# Patient Record
Sex: Male | Born: 1945 | Race: White | Hispanic: No | Marital: Married | Smoking: Former smoker
Health system: Southern US, Community
[De-identification: ages and names within clinical notes are randomized; demographics above are authoritative.]

## PROBLEM LIST (undated history)

## (undated) DIAGNOSIS — G4733 Obstructive sleep apnea (adult) (pediatric): Secondary | ICD-10-CM

## (undated) DIAGNOSIS — R197 Diarrhea, unspecified: Secondary | ICD-10-CM

## (undated) DIAGNOSIS — F329 Major depressive disorder, single episode, unspecified: Secondary | ICD-10-CM

## (undated) DIAGNOSIS — C679 Malignant neoplasm of bladder, unspecified: Secondary | ICD-10-CM

## (undated) DIAGNOSIS — G4730 Sleep apnea, unspecified: Secondary | ICD-10-CM

## (undated) DIAGNOSIS — N529 Male erectile dysfunction, unspecified: Secondary | ICD-10-CM

## (undated) DIAGNOSIS — M4312 Spondylolisthesis, cervical region: Secondary | ICD-10-CM

## (undated) DIAGNOSIS — M5459 Other low back pain: Secondary | ICD-10-CM

## (undated) DIAGNOSIS — R399 Unspecified symptoms and signs involving the genitourinary system: Secondary | ICD-10-CM

## (undated) DIAGNOSIS — J45909 Unspecified asthma, uncomplicated: Secondary | ICD-10-CM

## (undated) DIAGNOSIS — E119 Type 2 diabetes mellitus without complications: Secondary | ICD-10-CM

## (undated) DIAGNOSIS — E669 Obesity, unspecified: Secondary | ICD-10-CM

## (undated) DIAGNOSIS — Z87442 Personal history of urinary calculi: Secondary | ICD-10-CM

## (undated) DIAGNOSIS — I2510 Atherosclerotic heart disease of native coronary artery without angina pectoris: Secondary | ICD-10-CM

## (undated) DIAGNOSIS — M5030 Other cervical disc degeneration, unspecified cervical region: Secondary | ICD-10-CM

## (undated) DIAGNOSIS — E291 Testicular hypofunction: Secondary | ICD-10-CM

## (undated) DIAGNOSIS — I456 Pre-excitation syndrome: Secondary | ICD-10-CM

## (undated) DIAGNOSIS — K219 Gastro-esophageal reflux disease without esophagitis: Secondary | ICD-10-CM

## (undated) DIAGNOSIS — N400 Enlarged prostate without lower urinary tract symptoms: Secondary | ICD-10-CM

## (undated) DIAGNOSIS — M1990 Unspecified osteoarthritis, unspecified site: Secondary | ICD-10-CM

## (undated) DIAGNOSIS — M545 Low back pain: Secondary | ICD-10-CM

## (undated) DIAGNOSIS — I10 Essential (primary) hypertension: Secondary | ICD-10-CM

## (undated) DIAGNOSIS — I219 Acute myocardial infarction, unspecified: Secondary | ICD-10-CM

## (undated) DIAGNOSIS — F32A Depression, unspecified: Secondary | ICD-10-CM

## (undated) HISTORY — PX: BACK SURGERY: SHX140

## (undated) HISTORY — PX: COLONOSCOPY: SHX174

## (undated) HISTORY — PX: X-STOP IMPLANTATION: SHX2677

## (undated) HISTORY — PX: OTHER SURGICAL HISTORY: SHX169

## (undated) HISTORY — PX: LITHOTRIPSY: SUR834

## (undated) HISTORY — PX: HAND SURGERY: SHX662

---

## 1998-05-07 DIAGNOSIS — I219 Acute myocardial infarction, unspecified: Secondary | ICD-10-CM

## 1998-05-07 HISTORY — PX: CARDIAC CATHETERIZATION: SHX172

## 1998-05-07 HISTORY — DX: Acute myocardial infarction, unspecified: I21.9

## 2003-07-27 ENCOUNTER — Other Ambulatory Visit: Payer: Self-pay

## 2005-03-01 ENCOUNTER — Ambulatory Visit: Payer: Self-pay | Admitting: Unknown Physician Specialty

## 2005-05-23 ENCOUNTER — Ambulatory Visit: Payer: Self-pay | Admitting: Internal Medicine

## 2005-09-04 ENCOUNTER — Inpatient Hospital Stay (HOSPITAL_COMMUNITY): Admission: RE | Admit: 2005-09-04 | Discharge: 2005-09-07 | Payer: Self-pay | Admitting: Neurosurgery

## 2006-01-14 ENCOUNTER — Ambulatory Visit: Payer: Self-pay | Admitting: Orthopedic Surgery

## 2006-03-05 ENCOUNTER — Ambulatory Visit: Payer: Self-pay | Admitting: Urology

## 2006-03-22 ENCOUNTER — Ambulatory Visit: Payer: Self-pay | Admitting: Urology

## 2006-03-25 ENCOUNTER — Ambulatory Visit: Payer: Self-pay | Admitting: Gastroenterology

## 2006-05-23 ENCOUNTER — Ambulatory Visit: Payer: Self-pay | Admitting: Gastroenterology

## 2006-07-30 ENCOUNTER — Ambulatory Visit: Payer: Self-pay | Admitting: Urology

## 2007-11-29 ENCOUNTER — Emergency Department: Payer: Self-pay | Admitting: Emergency Medicine

## 2007-12-03 ENCOUNTER — Ambulatory Visit: Payer: Self-pay | Admitting: Urology

## 2007-12-09 ENCOUNTER — Ambulatory Visit: Payer: Self-pay | Admitting: Urology

## 2009-03-03 ENCOUNTER — Ambulatory Visit: Payer: Self-pay | Admitting: Urology

## 2009-03-08 ENCOUNTER — Ambulatory Visit: Payer: Self-pay | Admitting: Urology

## 2009-03-09 ENCOUNTER — Ambulatory Visit: Payer: Self-pay | Admitting: Urology

## 2009-07-06 ENCOUNTER — Emergency Department: Payer: Self-pay | Admitting: Emergency Medicine

## 2009-07-09 ENCOUNTER — Inpatient Hospital Stay: Payer: Self-pay | Admitting: Internal Medicine

## 2010-04-07 ENCOUNTER — Emergency Department: Payer: Self-pay | Admitting: Emergency Medicine

## 2010-04-08 ENCOUNTER — Emergency Department: Payer: Self-pay | Admitting: Emergency Medicine

## 2010-04-27 ENCOUNTER — Ambulatory Visit: Payer: Self-pay | Admitting: Urology

## 2010-05-03 ENCOUNTER — Ambulatory Visit: Payer: Self-pay | Admitting: Urology

## 2010-05-04 ENCOUNTER — Ambulatory Visit: Payer: Self-pay | Admitting: Urology

## 2010-05-17 ENCOUNTER — Ambulatory Visit: Payer: Self-pay | Admitting: Urology

## 2010-09-30 ENCOUNTER — Emergency Department: Payer: Self-pay | Admitting: Internal Medicine

## 2010-10-23 ENCOUNTER — Ambulatory Visit: Payer: Self-pay | Admitting: Urology

## 2012-03-26 DIAGNOSIS — N281 Cyst of kidney, acquired: Secondary | ICD-10-CM | POA: Insufficient documentation

## 2012-03-26 DIAGNOSIS — Z8551 Personal history of malignant neoplasm of bladder: Secondary | ICD-10-CM | POA: Insufficient documentation

## 2013-11-03 ENCOUNTER — Other Ambulatory Visit: Payer: Self-pay | Admitting: Gastroenterology

## 2013-11-03 LAB — CLOSTRIDIUM DIFFICILE(ARMC)

## 2013-11-05 LAB — STOOL CULTURE

## 2014-01-01 ENCOUNTER — Ambulatory Visit: Payer: Self-pay | Admitting: Gastroenterology

## 2014-01-04 LAB — PATHOLOGY REPORT

## 2014-01-07 DIAGNOSIS — I456 Pre-excitation syndrome: Secondary | ICD-10-CM | POA: Insufficient documentation

## 2014-01-07 DIAGNOSIS — I219 Acute myocardial infarction, unspecified: Secondary | ICD-10-CM | POA: Insufficient documentation

## 2014-01-31 DIAGNOSIS — G4733 Obstructive sleep apnea (adult) (pediatric): Secondary | ICD-10-CM | POA: Insufficient documentation

## 2014-01-31 DIAGNOSIS — E669 Obesity, unspecified: Secondary | ICD-10-CM | POA: Insufficient documentation

## 2014-01-31 DIAGNOSIS — I10 Essential (primary) hypertension: Secondary | ICD-10-CM | POA: Insufficient documentation

## 2014-01-31 DIAGNOSIS — I2510 Atherosclerotic heart disease of native coronary artery without angina pectoris: Secondary | ICD-10-CM | POA: Insufficient documentation

## 2014-02-15 DIAGNOSIS — F325 Major depressive disorder, single episode, in full remission: Secondary | ICD-10-CM | POA: Insufficient documentation

## 2014-02-15 DIAGNOSIS — Z8659 Personal history of other mental and behavioral disorders: Secondary | ICD-10-CM | POA: Insufficient documentation

## 2014-02-16 ENCOUNTER — Ambulatory Visit: Payer: Self-pay | Admitting: Gastroenterology

## 2014-02-18 LAB — PATHOLOGY REPORT

## 2014-05-19 ENCOUNTER — Ambulatory Visit: Payer: Self-pay | Admitting: Gastroenterology

## 2015-02-22 DIAGNOSIS — M5030 Other cervical disc degeneration, unspecified cervical region: Secondary | ICD-10-CM | POA: Insufficient documentation

## 2015-05-25 DIAGNOSIS — M1611 Unilateral primary osteoarthritis, right hip: Secondary | ICD-10-CM | POA: Insufficient documentation

## 2015-09-07 ENCOUNTER — Ambulatory Visit: Payer: Medicare Other | Admitting: Anesthesiology

## 2015-09-07 ENCOUNTER — Encounter: Payer: Self-pay | Admitting: *Deleted

## 2015-09-07 ENCOUNTER — Ambulatory Visit
Admission: RE | Admit: 2015-09-07 | Discharge: 2015-09-07 | Disposition: A | Discharge status: Home / Self Care | Payer: Medicare Other | Source: Ambulatory Visit | Attending: Unknown Physician Specialty | Admitting: Unknown Physician Specialty

## 2015-09-07 ENCOUNTER — Encounter
Admission: RE | Disposition: A | Discharge status: Home / Self Care | Payer: Self-pay | Source: Ambulatory Visit | Attending: Unknown Physician Specialty

## 2015-09-07 DIAGNOSIS — G4730 Sleep apnea, unspecified: Secondary | ICD-10-CM | POA: Diagnosis not present

## 2015-09-07 DIAGNOSIS — Z6832 Body mass index (BMI) 32.0-32.9, adult: Secondary | ICD-10-CM | POA: Diagnosis not present

## 2015-09-07 DIAGNOSIS — J449 Chronic obstructive pulmonary disease, unspecified: Secondary | ICD-10-CM | POA: Insufficient documentation

## 2015-09-07 DIAGNOSIS — M1991 Primary osteoarthritis, unspecified site: Secondary | ICD-10-CM | POA: Insufficient documentation

## 2015-09-07 DIAGNOSIS — Z8551 Personal history of malignant neoplasm of bladder: Secondary | ICD-10-CM | POA: Insufficient documentation

## 2015-09-07 DIAGNOSIS — K5730 Diverticulosis of large intestine without perforation or abscess without bleeding: Secondary | ICD-10-CM | POA: Insufficient documentation

## 2015-09-07 DIAGNOSIS — E669 Obesity, unspecified: Secondary | ICD-10-CM | POA: Insufficient documentation

## 2015-09-07 DIAGNOSIS — E119 Type 2 diabetes mellitus without complications: Secondary | ICD-10-CM | POA: Insufficient documentation

## 2015-09-07 DIAGNOSIS — Z87891 Personal history of nicotine dependence: Secondary | ICD-10-CM | POA: Diagnosis not present

## 2015-09-07 DIAGNOSIS — D123 Benign neoplasm of transverse colon: Secondary | ICD-10-CM | POA: Diagnosis not present

## 2015-09-07 DIAGNOSIS — F329 Major depressive disorder, single episode, unspecified: Secondary | ICD-10-CM | POA: Insufficient documentation

## 2015-09-07 DIAGNOSIS — I252 Old myocardial infarction: Secondary | ICD-10-CM | POA: Insufficient documentation

## 2015-09-07 DIAGNOSIS — I10 Essential (primary) hypertension: Secondary | ICD-10-CM | POA: Diagnosis not present

## 2015-09-07 DIAGNOSIS — Z1211 Encounter for screening for malignant neoplasm of colon: Secondary | ICD-10-CM | POA: Diagnosis present

## 2015-09-07 DIAGNOSIS — G4733 Obstructive sleep apnea (adult) (pediatric): Secondary | ICD-10-CM | POA: Insufficient documentation

## 2015-09-07 DIAGNOSIS — Z79899 Other long term (current) drug therapy: Secondary | ICD-10-CM | POA: Insufficient documentation

## 2015-09-07 DIAGNOSIS — Z7982 Long term (current) use of aspirin: Secondary | ICD-10-CM | POA: Diagnosis not present

## 2015-09-07 HISTORY — DX: Atherosclerotic heart disease of native coronary artery without angina pectoris: I25.10

## 2015-09-07 HISTORY — DX: Benign prostatic hyperplasia without lower urinary tract symptoms: N40.0

## 2015-09-07 HISTORY — PX: COLONOSCOPY WITH PROPOFOL: SHX5780

## 2015-09-07 HISTORY — DX: Type 2 diabetes mellitus without complications: E11.9

## 2015-09-07 HISTORY — DX: Other cervical disc degeneration, unspecified cervical region: M50.30

## 2015-09-07 HISTORY — DX: Diarrhea, unspecified: R19.7

## 2015-09-07 HISTORY — DX: Depression, unspecified: F32.A

## 2015-09-07 HISTORY — DX: Other low back pain: M54.59

## 2015-09-07 HISTORY — DX: Obesity, unspecified: E66.9

## 2015-09-07 HISTORY — DX: Unspecified osteoarthritis, unspecified site: M19.90

## 2015-09-07 HISTORY — DX: Spondylolisthesis, cervical region: M43.12

## 2015-09-07 HISTORY — DX: Major depressive disorder, single episode, unspecified: F32.9

## 2015-09-07 HISTORY — DX: Acute myocardial infarction, unspecified: I21.9

## 2015-09-07 HISTORY — DX: Low back pain: M54.5

## 2015-09-07 HISTORY — DX: Essential (primary) hypertension: I10

## 2015-09-07 HISTORY — DX: Sleep apnea, unspecified: G47.30

## 2015-09-07 HISTORY — DX: Male erectile dysfunction, unspecified: N52.9

## 2015-09-07 HISTORY — DX: Unspecified symptoms and signs involving the genitourinary system: R39.9

## 2015-09-07 HISTORY — DX: Obstructive sleep apnea (adult) (pediatric): G47.33

## 2015-09-07 HISTORY — DX: Pre-excitation syndrome: I45.6

## 2015-09-07 HISTORY — DX: Malignant neoplasm of bladder, unspecified: C67.9

## 2015-09-07 HISTORY — DX: Testicular hypofunction: E29.1

## 2015-09-07 SURGERY — COLONOSCOPY WITH PROPOFOL
Anesthesia: General

## 2015-09-07 MED ORDER — PROPOFOL 500 MG/50ML IV EMUL
INTRAVENOUS | Status: DC | PRN
  Administered 2015-09-07: 120 ug/kg/min via INTRAVENOUS

## 2015-09-07 MED ORDER — SODIUM CHLORIDE 0.9 % IV SOLN
INTRAVENOUS | Status: DC
  Administered 2015-09-07: 08:00:00 via INTRAVENOUS

## 2015-09-07 MED ORDER — MIDAZOLAM HCL 2 MG/2ML IJ SOLN
INTRAMUSCULAR | Status: DC | PRN
  Administered 2015-09-07: 1 mg via INTRAVENOUS

## 2015-09-07 MED ORDER — FENTANYL CITRATE (PF) 100 MCG/2ML IJ SOLN
INTRAMUSCULAR | Status: DC | PRN
  Administered 2015-09-07: 50 ug via INTRAVENOUS

## 2015-09-07 MED ORDER — POLYVINYL ALCOHOL 1.4 % OP SOLN
1.0000 [drp] | OPHTHALMIC | Status: DC | PRN
  Administered 2015-09-07: 1 [drp] via OPHTHALMIC
  Filled 2015-09-07: qty 15

## 2015-09-07 MED ORDER — SODIUM CHLORIDE 0.9 % IV SOLN: INTRAVENOUS | Status: DC

## 2015-09-07 NOTE — Anesthesia Procedure Notes (Addendum)
Performed by: Vaughan Sine Pre-anesthesia Checklist: Emergency Drugs available, Patient identified, Suction available, Patient being monitored and Timeout performed Patient Re-evaluated:Patient Re-evaluated prior to inductionOxygen Delivery Method: Non-rebreather mask Preoxygenation: Pre-oxygenation with 100% oxygen Intubation Type: IV induction Placement Confirmation: positive ETCO2 and CO2 detector   Performed by: Donalda Ewings, Trajon Rosete Ventilation: Oral airway inserted - appropriate to patient size

## 2015-09-07 NOTE — Transfer of Care (Signed)
Immediate Anesthesia Transfer of Care Note  Patient: Eric Hanna  Procedure(s) Performed: Procedure(s): COLONOSCOPY WITH PROPOFOL (N/A)  Patient Location: PACU  Anesthesia Type:General  Level of Consciousness: awake and sedated  Airway & Oxygen Therapy: Patient Spontanous Breathing and Patient connected to face mask oxygen  Post-op Assessment: Report given to RN and Post -op Vital signs reviewed and stable  Post vital signs: Reviewed  Last Vitals:  Filed Vitals:   09/07/15 0738  BP: 125/76  Pulse: 63  Temp: 36.6 C  Resp: 17    Last Pain: There were no vitals filed for this visit.       Complications: No apparent anesthesia complications

## 2015-09-07 NOTE — Anesthesia Postprocedure Evaluation (Signed)
Anesthesia Post Note  Patient: Eric Hanna  Procedure(s) Performed: Procedure(s) (LRB): COLONOSCOPY WITH PROPOFOL (N/A)  Patient location during evaluation: Endoscopy Anesthesia Type: General Level of consciousness: awake and alert Pain management: pain level controlled Vital Signs Assessment: post-procedure vital signs reviewed and stable Respiratory status: spontaneous breathing, nonlabored ventilation, respiratory function stable and patient connected to nasal cannula oxygen Cardiovascular status: blood pressure returned to baseline and stable Postop Assessment: no signs of nausea or vomiting Anesthetic complications: no    Last Vitals:  Filed Vitals:   09/07/15 0915 09/07/15 0925  BP: 115/80 117/71  Pulse: 59   Temp:    Resp: 10     Last Pain: There were no vitals filed for this visit.               Martha Clan

## 2015-09-07 NOTE — Anesthesia Preprocedure Evaluation (Signed)
Anesthesia Evaluation  Patient identified by MRN, date of birth, ID band Patient awake    Reviewed: Allergy & Precautions, H&P , NPO status , Patient's Chart, lab work & pertinent test results, reviewed documented beta blocker date and time   History of Anesthesia Complications Negative for: history of anesthetic complications  Airway Mallampati: III  TM Distance: >3 FB Neck ROM: full    Dental no notable dental hx. (+) Caps   Pulmonary neg shortness of breath, sleep apnea , COPD, neg recent URI, former smoker,    Pulmonary exam normal breath sounds clear to auscultation       Cardiovascular Exercise Tolerance: Good hypertension, (-) angina+ CAD and + Past MI  (-) Cardiac Stents and (-) CABG Normal cardiovascular exam+ dysrhythmias (WPW) (-) Valvular Problems/Murmurs Rhythm:regular Rate:Normal     Neuro/Psych PSYCHIATRIC DISORDERS (Depression) negative neurological ROS     GI/Hepatic Neg liver ROS, GERD  ,  Endo/Other  diabetes (borderline)  Renal/GU Renal disease (kidney stones)  negative genitourinary   Musculoskeletal   Abdominal   Peds  Hematology negative hematology ROS (+)   Anesthesia Other Findings Past Medical History:   Osteoarthritis                                               Lumbar trigger point syndrome                                Spondylolisthesis of cervical region                         DDD (degenerative disc disease), cervical                    Depression                                                   Coronary artery disease                                      Sleep apnea                                                  Hypertension                                                 OSA (obstructive sleep apnea)                                WPW (Wolff-Parkinson-White syndrome)                         Obesity  Diarrhea                                                      Hypertrophy of prostate                                      Lower urinary tract symptoms (LUTS)                          Impotence, organic                                           Malignant neoplasm of bladder [facility])                          Testicular hypofunction                                      Myocardial infarction ([place])                     2000         Diabetes mellitus without complication ([place])                   Comment:borderline; no meds; no blood sugar checks   Reproductive/Obstetrics negative OB ROS                             Anesthesia Physical Anesthesia Plan  ASA: III  Anesthesia Plan: General   Post-op Pain Management:    Induction:   Airway Management Planned:   Additional Equipment:   Intra-op Plan:   Post-operative Plan:   Informed Consent: I have reviewed the patients History and Physical, chart, labs and discussed the procedure including the risks, benefits and alternatives for the proposed anesthesia with the patient or authorized representative who has indicated his/her understanding and acceptance.   Dental Advisory Given  Plan Discussed with: Anesthesiologist, CRNA and Surgeon  Anesthesia Plan Comments:         Anesthesia Quick Evaluation

## 2015-09-07 NOTE — Op Note (Signed)
[facility] Gastroenterology Patient Name: Eric Hanna Procedure Date: 09/07/2015 8:13 AM MRN: [id number] Account #: [id number] Date of Birth: 04-May-1946 Admit Type: Outpatient Age: 70 Room: [facility] ENDO ROOM 4 Gender: Male Note Status: Finalized Procedure:            Colonoscopy Indications:          High risk colon cancer surveillance: Personal history                        of colonic polyps Providers:            Manya Silvas, MD Referring MD:         Ocie Cornfield. Ouida Sills, MD (Referring MD) Medicines:            Propofol per Anesthesia Complications:        No immediate complications. Procedure:            Pre-Anesthesia Assessment:                       - After reviewing the risks and benefits, the patient                        was deemed in satisfactory condition to undergo the                        procedure.                       After obtaining informed consent, the colonoscope was                        passed under direct vision. Throughout the procedure,                        the patient's blood pressure, pulse, and oxygen                        saturations were monitored continuously. The                        Colonoscope was introduced through the anus and                        advanced to the the cecum, identified by appendiceal                        orifice and ileocecal valve. The colonoscopy was                        performed without difficulty. The patient tolerated the                        procedure well. The quality of the bowel preparation                        was excellent. Findings:      A 9 mm polyp was found in the transverse colon. The polyp was sessile.       The polyp was removed with a hot snare. Resection and retrieval were       complete. To prevent bleeding after the polypectomy, one hemostatic clip  was successfully placed. There was no bleeding during, or at the end, of       the procedure.      A small  polyp was found in the transverse colon. The polyp was sessile.       The polyp was removed with a hot snare. Resection and retrieval were       complete.      A small polyp was found in the transverse colon. The polyp was sessile.       The polyp was removed with a hot snare. Resection and retrieval were       complete.      A small polyp was found in the hepatic flexure. The polyp was sessile.       The polyp was removed with a hot snare. Resection and retrieval were       complete.      A diffuse area of granular mucosa was found at the ileocecal valve.       Biopsies were taken with a cold forceps for histology.      Multiple medium-mouthed diverticula were found in the sigmoid colon.      The terminal ileum appeared normal. Impression:           - One 9 mm polyp in the transverse colon, removed with                        a hot snare. Resected and retrieved. Clip was placed.                       - One small polyp in the transverse colon, removed with                        a hot snare. Resected and retrieved.                       - One small polyp in the transverse colon, removed with                        a hot snare. Resected and retrieved.                       - One small polyp at the hepatic flexure, removed with                        a hot snare. Resected and retrieved.                       - Granularity at the ileocecal valve. Biopsied.                       - Diverticulosis in the sigmoid colon.                       - The examined portion of the ileum was normal. Recommendation:       - Await pathology results. Manya Silvas, MD 09/07/2015 8:58:48 AM This report has been signed electronically. Number of Addenda: 0 Note Initiated On: 09/07/2015 8:13 AM Scope Withdrawal Time: 0 hours 17 minutes 31 seconds  Total Procedure Duration: 0 hours 30 minutes 30 seconds       [facility]

## 2015-09-07 NOTE — H&P (Signed)
Primary Care Physician:  Kirk Ruths., MD Primary Gastroenterologist:  Dr. Vira Agar  Pre-Procedure History & Physical: HPI:  Eric Hanna is a 70 y.o. male is here for an colonoscopy.   Past Medical History  Diagnosis Date  . Osteoarthritis   . Lumbar trigger point syndrome   . Spondylolisthesis of cervical region   . DDD (degenerative disc disease), cervical   . Depression   . Coronary artery disease   . Sleep apnea   . Hypertension   . OSA (obstructive sleep apnea)   . WPW (Wolff-Parkinson-White syndrome)   . Obesity   . Diarrhea   . Hypertrophy of prostate   . Lower urinary tract symptoms (LUTS)   . Impotence, organic   . Malignant neoplasm of bladder ([place])   . Testicular hypofunction   . Myocardial infarction ([place]) 2000  . Diabetes mellitus without complication ([place])     borderline; no meds; no blood sugar checks    Past Surgical History  Procedure Laterality Date  . Hand surgery    . Chin implant     . Stomach reduction without bypass    . Colonoscopy    . X-stop implantation    . Cardiac catheterization      Prior to Admission medications   Medication Sig Start Date End Date Taking? Authorizing Provider  alendronate (FOSAMAX) 70 MG tablet Take 70 mg by mouth once a week. Take with a full glass of water on an empty stomach.   Yes Historical Provider, MD  aspirin 81 MG tablet Take 81 mg by mouth daily.   Yes Historical Provider, MD  Calcium Carbonate-Vitamin D 600-400 MG-UNIT tablet Take 1 tablet by mouth daily.   Yes Historical Provider, MD  fexofenadine-pseudoephedrine (ALLEGRA-D 24) 180-240 MG 24 hr tablet Take 1 tablet by mouth daily.   Yes Historical Provider, MD  metoprolol succinate (TOPROL-XL) 50 MG 24 hr tablet Take 50 mg by mouth daily. Take with or immediately following a meal.   Yes Historical Provider, MD  Multiple Vitamin (MULTIVITAMIN) capsule Take 1 capsule by mouth daily.   Yes Historical Provider, MD  omeprazole (PRILOSEC) 20 MG  capsule Take 20 mg by mouth daily.   Yes Historical Provider, MD  sildenafil (VIAGRA) 100 MG tablet Take 100 mg by mouth daily as needed for erectile dysfunction.   Yes Historical Provider, MD  simvastatin (ZOCOR) 80 MG tablet Take 80 mg by mouth daily.   Yes Historical Provider, MD  Testosterone 75 MG PLLT 75 mg by Implant route.   Yes Historical Provider, MD  traMADol (ULTRAM) 50 MG tablet Take 100 mg by mouth every 6 (six) hours as needed.   Yes Historical Provider, MD    Allergies as of 08/30/2015  . (Not on File)    History reviewed. No pertinent family history.  Social History   Social History  . Marital Status: Married    Spouse Name: N/A  . Number of Children: N/A  . Years of Education: N/A   Occupational History  . Not on file.   Social History Main Topics  . Smoking status: Former Research scientist (life sciences)  . Smokeless tobacco: Not on file  . Alcohol Use: No  . Drug Use: No  . Sexual Activity: Not on file   Other Topics Concern  . Not on file   Social History Narrative    Review of Systems: See HPI, otherwise negative ROS  Physical Exam: BP 125/76 mmHg  Pulse 63  Temp(Src) 97.8 F (36.6 C) (Tympanic)  Resp 17  Ht 6\' 1"  (1.854 m)  Wt 113.399 kg (250 lb)  BMI 32.99 kg/m2  SpO2 99% General:   Alert,  pleasant and cooperative in NAD Head:  Normocephalic and atraumatic. Neck:  Supple; no masses or thyromegaly. Lungs:  Clear throughout to auscultation.    Heart:  Regular rate and rhythm. Abdomen:  Soft, nontender and nondistended. Normal bowel sounds, without guarding, and without rebound.   Neurologic:  Alert and  oriented x4;  grossly normal neurologically.  Impression/Plan: Eric Hanna is here for an colonoscopy to be performed for [facility] colon polyps  Risks, benefits, limitations, and alternatives regarding  colonoscopy have been reviewed with the patient.  Questions have been answered.  All parties agreeable.   Gaylyn Cheers, MD  09/07/2015, 8:12 AM

## 2015-09-09 ENCOUNTER — Encounter: Payer: Self-pay | Admitting: Unknown Physician Specialty

## 2015-09-09 LAB — SURGICAL PATHOLOGY

## 2016-12-27 ENCOUNTER — Emergency Department: Payer: Medicare Other

## 2016-12-27 ENCOUNTER — Emergency Department
Admission: EM | Admit: 2016-12-27 | Discharge: 2016-12-27 | Disposition: A | Discharge status: Home / Self Care | Payer: Medicare Other | Attending: Emergency Medicine | Admitting: Emergency Medicine

## 2016-12-27 ENCOUNTER — Encounter: Payer: Self-pay | Admitting: Medical Oncology

## 2016-12-27 DIAGNOSIS — E119 Type 2 diabetes mellitus without complications: Secondary | ICD-10-CM | POA: Diagnosis not present

## 2016-12-27 DIAGNOSIS — I10 Essential (primary) hypertension: Secondary | ICD-10-CM | POA: Insufficient documentation

## 2016-12-27 DIAGNOSIS — R42 Dizziness and giddiness: Secondary | ICD-10-CM | POA: Diagnosis not present

## 2016-12-27 DIAGNOSIS — Z87891 Personal history of nicotine dependence: Secondary | ICD-10-CM | POA: Diagnosis not present

## 2016-12-27 DIAGNOSIS — Z79899 Other long term (current) drug therapy: Secondary | ICD-10-CM | POA: Diagnosis not present

## 2016-12-27 DIAGNOSIS — I2510 Atherosclerotic heart disease of native coronary artery without angina pectoris: Secondary | ICD-10-CM | POA: Diagnosis not present

## 2016-12-27 DIAGNOSIS — Z7982 Long term (current) use of aspirin: Secondary | ICD-10-CM | POA: Diagnosis not present

## 2016-12-27 LAB — CBC WITH DIFFERENTIAL/PLATELET
Basophils Absolute: 0 10*3/uL (ref 0–0.1)
Basophils Relative: 1 %
EOS PCT: 4 %
Eosinophils Absolute: 0.2 10*3/uL (ref 0–0.7)
HCT: 50.6 % (ref 40.0–52.0)
Hemoglobin: 17.3 g/dL (ref 13.0–18.0)
LYMPHS ABS: 1.4 10*3/uL (ref 1.0–3.6)
LYMPHS PCT: 25 %
MCH: 30.3 pg (ref 26.0–34.0)
MCHC: 34.2 g/dL (ref 32.0–36.0)
MCV: 88.6 fL (ref 80.0–100.0)
MONO ABS: 0.6 10*3/uL (ref 0.2–1.0)
MONOS PCT: 11 %
Neutro Abs: 3.4 10*3/uL (ref 1.4–6.5)
Neutrophils Relative %: 59 %
PLATELETS: 137 10*3/uL — AB (ref 150–440)
RBC: 5.71 MIL/uL (ref 4.40–5.90)
RDW: 13.7 % (ref 11.5–14.5)
WBC: 5.6 10*3/uL (ref 3.8–10.6)

## 2016-12-27 LAB — URINALYSIS, COMPLETE (UACMP) WITH MICROSCOPIC
BACTERIA UA: NONE SEEN
Bilirubin Urine: NEGATIVE
Glucose, UA: NEGATIVE mg/dL
Hgb urine dipstick: NEGATIVE
KETONES UR: NEGATIVE mg/dL
Leukocytes, UA: NEGATIVE
Nitrite: NEGATIVE
Protein, ur: NEGATIVE mg/dL
SPECIFIC GRAVITY, URINE: 1.019 (ref 1.005–1.030)
SQUAMOUS EPITHELIAL / LPF: NONE SEEN
WBC, UA: NONE SEEN WBC/hpf (ref 0–5)
pH: 6 (ref 5.0–8.0)

## 2016-12-27 LAB — BASIC METABOLIC PANEL
Anion gap: 6 (ref 5–15)
BUN: 25 mg/dL — AB (ref 6–20)
CO2: 30 mmol/L (ref 22–32)
Calcium: 9.5 mg/dL (ref 8.9–10.3)
Chloride: 105 mmol/L (ref 101–111)
Creatinine, Ser: 1.13 mg/dL (ref 0.61–1.24)
GFR calc Af Amer: >60 mL/min (ref >60)
GLUCOSE: 135 mg/dL — AB (ref 65–99)
POTASSIUM: 4.4 mmol/L (ref 3.5–5.1)
Sodium: 141 mmol/L (ref 135–145)

## 2016-12-27 LAB — TROPONIN I: Troponin I: <0.03 ng/mL (ref <0.03)

## 2016-12-27 MED ORDER — MECLIZINE HCL 25 MG PO TABS
25.0000 mg | ORAL_TABLET | Freq: Three times a day (TID) | ORAL | 0 refills | Status: DC | PRN
Start: 2016-12-27 — End: 2017-08-09

## 2016-12-27 MED ORDER — DIAZEPAM 5 MG PO TABS: 5.0000 mg | ORAL_TABLET | Freq: Once | ORAL | Status: DC

## 2016-12-27 MED ORDER — DIAZEPAM 5 MG PO TABS: 5.0000 mg | ORAL_TABLET | Freq: Three times a day (TID) | ORAL | 0 refills | Status: DC | PRN

## 2016-12-27 MED ORDER — SODIUM CHLORIDE 0.9 % IV BOLUS (SEPSIS)
500.0000 mL | Freq: Once | INTRAVENOUS | Status: AC
  Administered 2016-12-27: 500 mL via INTRAVENOUS

## 2016-12-27 MED ORDER — MECLIZINE HCL 25 MG PO TABS
25.0000 mg | ORAL_TABLET | Freq: Once | ORAL | Status: AC
  Administered 2016-12-27: 25 mg via ORAL
  Filled 2016-12-27: qty 1

## 2016-12-27 NOTE — Consult Note (Signed)
Referring Physician: Alfred Levins    Chief Complaint: Dizziness  HPI: Eric Hanna is an 71 y.o. male with a history of DM and HTN who reports that he awakened on yesterday and when he stood fell back onto the bed.  This happened twice.  Patient reports feeling as if the ground is moving.  Was able to perform his usual activities but felt unsure walking.  Did well while lying down but on sitting or standing felt off balance.  Awakened this morning feeling no better and presented for evaluation.  Initial NIHSS of 0. Patient with no complaints of tinnitus or decreased hearing.  No recent illness.    Date last known well: Date: 12/25/2016 Time last known well: Time: 23:00 tPA Given: No: Outside treatment window  Past Medical History:  Diagnosis Date  . Coronary artery disease   . DDD (degenerative disc disease), cervical   . Depression   . Diabetes mellitus without complication ([place])    borderline; no meds; no blood sugar checks  . Diarrhea   . Hypertension   . Hypertrophy of prostate   . Impotence, organic   . Lower urinary tract symptoms (LUTS)   . Lumbar trigger point syndrome   . Malignant neoplasm of bladder ([place])   . Myocardial infarction ([place]) 2000  . Obesity   . OSA (obstructive sleep apnea)   . Osteoarthritis   . Sleep apnea   . Spondylolisthesis of cervical region   . Testicular hypofunction   . WPW (Wolff-Parkinson-White syndrome)     Past Surgical History:  Procedure Laterality Date  . CARDIAC CATHETERIZATION    . CHIN IMPLANT     . COLONOSCOPY    . COLONOSCOPY WITH PROPOFOL N/A 09/07/2015   Procedure: COLONOSCOPY WITH PROPOFOL;  Surgeon: Manya Silvas, MD;  Location: [facility] ENDOSCOPY;  Service: Endoscopy;  Laterality: N/A;  . HAND SURGERY    . STOMACH REDUCTION WITHOUT BYPASS    . X-STOP IMPLANTATION      Family history:  Father deceased from bladder cancer.  Mother deceased from emphysema  Social History:  reports that he has quit smoking. He does not have  any smokeless tobacco history on file. He reports that he does not drink alcohol or use drugs.  Allergies: No Known Allergies  Medications: I have reviewed the patient's current medications. Prior to Admission:  Prior to Admission medications   Medication Sig Start Date End Date Taking? Authorizing Provider  alendronate (FOSAMAX) 70 MG tablet Take 70 mg by mouth once a week. Take with a full glass of water on an empty stomach.   Yes [provider]  aspirin 81 MG tablet Take 81 mg by mouth daily.   Yes [provider]  Calcium Carbonate-Vitamin D 600-400 MG-UNIT tablet Take 1 tablet by mouth daily.   Yes [provider]  cetirizine (ZYRTEC) 10 MG tablet Take 10 mg by mouth daily.   Yes [provider]  COENZYME Q10 PO Take 1 capsule by mouth daily.   Yes [provider]  metoprolol succinate (TOPROL-XL) 50 MG 24 hr tablet Take 50 mg by mouth daily. Take with or immediately following a meal.   Yes [provider]  Multiple Vitamin (MULTIVITAMIN) capsule Take 1 capsule by mouth daily.   Yes [provider]  omeprazole (PRILOSEC) 20 MG capsule Take 20 mg by mouth daily.   Yes [provider]  sildenafil (VIAGRA) 100 MG tablet Take 100 mg by mouth daily as needed for erectile dysfunction.  Yes [provider]  simvastatin (ZOCOR) 80 MG tablet Take 80 mg by mouth daily.   Yes [provider]  Testosterone 75 MG PLLT 75 mg by Implant route. Every 4-6 months.   Yes [provider]  traZODone (DESYREL) 50 MG tablet Take 50 mg by mouth at bedtime. 11/12/16  Yes [provider]  diazepam (VALIUM) 5 MG tablet Take 1 tablet (5 mg total) by mouth every 8 (eight) hours as needed (dizziness). 12/27/16 12/27/17  Rudene Re, MD  meclizine (ANTIVERT) 25 MG tablet Take 1 tablet (25 mg total) by mouth 3 (three) times daily as needed for dizziness. 12/27/16   Rudene Re, MD     ROS: History  obtained from the patient  General ROS: negative for - chills, fatigue, fever, night sweats, weight gain or weight loss Psychological ROS: negative for - behavioral disorder, hallucinations, memory difficulties, mood swings or suicidal ideation Ophthalmic ROS: negative for - blurry vision, double vision, eye pain or loss of vision ENT ROS: negative for - epistaxis, nasal discharge, oral lesions, sore throat, tinnitus Allergy and Immunology ROS: negative for - hives or itchy/watery eyes Hematological and Lymphatic ROS: negative for - bleeding problems, bruising or swollen lymph nodes Endocrine ROS: negative for - galactorrhea, hair pattern changes, polydipsia/polyuria or temperature intolerance Respiratory ROS: negative for - cough, hemoptysis, shortness of breath or wheezing Cardiovascular ROS: negative for - chest pain, dyspnea on exertion, edema or irregular heartbeat Gastrointestinal ROS: negative for - abdominal pain, diarrhea, hematemesis, nausea/vomiting or stool incontinence Genito-Urinary ROS: negative for - dysuria, hematuria, incontinence or urinary frequency/urgency Musculoskeletal ROS: negative for - joint swelling or muscular weakness Neurological ROS: as noted in HPI Dermatological ROS: negative for rash and skin lesion changes  Physical Examination: Blood pressure 111/75, pulse 63, temperature 98 F (36.7 C), resp. rate 16, height 6\' 1"  (1.854 m), weight 109.8 kg (242 lb), SpO2 94 %.  HEENT-  Normocephalic, no lesions, without obvious abnormality.  Normal external eye and conjunctiva.  Normal TM's bilaterally.  Normal auditory canals and external ears. Normal external nose, mucus membranes and septum.  Normal pharynx. Cardiovascular- S1, S2 normal, pulses palpable throughout   Lungs- chest clear, no wheezing, rales, normal symmetric air entry Abdomen- soft, non-tender; bowel sounds normal; no masses,  no organomegaly Extremities- no edema Lymph-no adenopathy  palpable Musculoskeletal-no joint tenderness, deformity or swelling Skin-warm and dry, no hyperpigmentation, vitiligo, or suspicious lesions  Neurological Examination   Mental Status: Alert, oriented, thought content appropriate.  Speech fluent without evidence of aphasia.  Able to follow 3 step commands without difficulty. Cranial Nerves: II: Discs flat bilaterally; Visual fields grossly normal, pupils equal, round, reactive to light and accommodation III,IV, VI: ptosis not present, extra-ocular motions intact bilaterally.  Extinguishing nystagmus with change in position. V,VII: smile symmetric, facial light touch sensation normal bilaterally VIII: hearing normal bilaterally IX,X: gag reflex present XI: bilateral shoulder shrug XII: midline tongue extension Motor: Right : Upper extremity   5/5    Left:     Upper extremity   5/5  Lower extremity   5/5     Lower extremity   5/5 Tone and bulk:normal tone throughout; no atrophy noted Sensory: Pinprick and light touch intact throughout, bilaterally Deep Tendon Reflexes: 2+ and symmetric throughout Plantars: Right: downgoing   Left: downgoing Cerebellar: normal finger-to-nose, normal rapid alternating movements and normal heel-to-shin test Gait: guarded and wide based    Laboratory Studies:  Basic Metabolic Panel:  Recent Labs Lab 12/27/16 0919  NA 141  K 4.4  CL 105  CO2 30  GLUCOSE 135*  BUN 25*  CREATININE 1.13  CALCIUM 9.5    Liver Function Tests: No results for input(s): AST, ALT, ALKPHOS, BILITOT, PROT, ALBUMIN in the last 168 hours. No results for input(s): LIPASE, AMYLASE in the last 168 hours. No results for input(s): AMMONIA in the last 168 hours.  CBC:  Recent Labs Lab 12/27/16 0919  WBC 5.6  NEUTROABS 3.4  HGB 17.3  HCT 50.6  MCV 88.6  PLT 137*    Cardiac Enzymes:  Recent Labs Lab 12/27/16 0919  TROPONINI <0.03    BNP: Invalid input(s): POCBNP  CBG: No results for input(s): GLUCAP in  the last 168 hours.  Microbiology: Results for orders placed or performed in visit on 11/03/13  Stool culture     Status: None   Collection Time: 11/03/13  6:00 AM  Result Value Ref Range Status   Micro Text Report   Final       COMMENT                   NO SALMONELLA OR SHIGELLA ISOLATED   COMMENT                   NO PATHOGENIC E.COLI DETECTED   COMMENT                   NO CAMPYLOBACTER ANTIGEN DETECTED   ANTIBIOTIC                                                      Clostridium Difficile [facility])     Status: None   Collection Time: 11/03/13  6:00 AM  Result Value Ref Range Status   Micro Text Report   Final       C.DIFFICILE ANTIGEN       C.DIFFICILE GDH ANTIGEN : NEGATIVE   C.DIFFICILE TOXIN A/B     C.DIFFICILE TOXINS A AND B : NEGATIVE   INTERPRETATION            Negative for C. difficile.    ANTIBIOTIC                                                        Coagulation Studies: No results for input(s): LABPROT, INR in the last 72 hours.  Urinalysis:  Recent Labs Lab 12/27/16 1116  COLORURINE YELLOW*  LABSPEC 1.019  PHURINE 6.0  GLUCOSEU NEGATIVE  HGBUR NEGATIVE  BILIRUBINUR NEGATIVE  KETONESUR NEGATIVE  PROTEINUR NEGATIVE  NITRITE NEGATIVE  LEUKOCYTESUR NEGATIVE    Lipid Panel: No results found for: CHOL, TRIG, HDL, CHOLHDL, VLDL, LDLCALC  HgbA1C: No results found for: HGBA1C  Urine Drug Screen:  No results found for: LABOPIA, COCAINSCRNUR, LABBENZ, AMPHETMU, THCU, LABBARB  Alcohol Level: No results for input(s): ETH in the last 168 hours.  Other results: EKG: sinus rhythm at 57 bpm.  Imaging: Ct Head Wo Contrast  Result Date: 12/27/2016 CLINICAL DATA:  Altered mental status, unsteady gait EXAM: CT HEAD WITHOUT CONTRAST TECHNIQUE: Contiguous axial images were obtained from the base of the skull through the vertex without intravenous contrast. COMPARISON:  None available FINDINGS: Brain: Age related brain atrophy with associated chronic white matter  microvascular ischemic changes throughout the cerebral hemispheres bilaterally. No acute intracranial hemorrhage, new infarction, mass lesion, midline shift, herniation, hydrocephalus, or extra-axial fluid collection. No focal mass effect or edema. Cisterns patent. Cerebellar atrophy as well. Vascular: Atherosclerosis of the intracranial vessels of the skullbase. No hyperdense vessel. Skull: Normal. Negative for fracture or focal lesion. Sinuses/Orbits: No acute finding. Other: None. IMPRESSION: Brain atrophy and chronic white matter microvascular ischemic changes. No acute intracranial abnormality by noncontrast CT. Electronically Signed   By: Jerilynn Mages.  Shick M.D.   On: 12/27/2016 10:21   Mr Brain Wo Contrast  Result Date: 12/27/2016 CLINICAL DATA:  Gait imbalance when standing up out of bed, tilting sensation. EXAM: MRI HEAD WITHOUT CONTRAST TECHNIQUE: Multiplanar, multiecho pulse sequences of the brain and surrounding structures were obtained without intravenous contrast. COMPARISON:  CT HEAD December 27, 2016 at 1007 hours FINDINGS: BRAIN: No reduced diffusion to suggest acute ischemia. No susceptibility artifact to suggest hemorrhage. The ventricles and sulci are normal for patient's age. Scattered subcentimeter supratentorial white matter FLAIR T2 hyperintensities compatible with mild chronic small vessel ischemic disease, less than expected for age. No suspicious parenchymal signal, mass or mass effect. No abnormal extra-axial fluid collections. VASCULAR: Normal major intracranial vascular flow voids present at skull base. SKULL AND UPPER CERVICAL SPINE: No abnormal sellar expansion. No suspicious calvarial bone marrow signal. Craniocervical junction maintained. SINUSES/ORBITS: Trace paranasal sinus mucosal thickening without air-fluid levels. Mastoid air cells are well aerated. The included ocular globes and orbital contents are non-suspicious. OTHER: None. IMPRESSION: Negative noncontrast MRI of the head for  age. Electronically Signed   By: Elon Alas M.D.   On: 12/27/2016 13:31    Assessment: 71 y.o. male presenting with vertigo.  Neurological examination is nonfocal.  MRI of the brain reviewed and shows no acute changes.  Do not suspect acute infarct.  Concern is that his symptoms are from an inner ear pathology.    Stroke Risk Factors - diabetes mellitus and hypertension  Plan: 1.  Trial of Meclizine 25mg  q 8 hours prn 2.  ENT evaluation 3.  Continue ASA   Case discussed with Dr. Unk Lightning, MD Neurology [phone] 12/27/2016, 2:43 PM

## 2016-12-27 NOTE — ED Triage Notes (Signed)
Pt reports for the past 2 days he has been feeling "off balance" states that when he stands up out of bed he feels like he is tilting over. Pt denies pain or nausea. NAD noted. A/O x 4.

## 2016-12-27 NOTE — ED Notes (Signed)
Ambulated patient.  Patient tolerated well.  Gait steady.  Posture upright and relaxed.  MAE equally and strong. NAD

## 2016-12-27 NOTE — ED Provider Notes (Signed)
[facility] Emergency Department Provider Note  ____________________________________________  Time seen: Approximately 9:42 AM  I have reviewed the triage vital signs and the nursing notes.   HISTORY  Chief Complaint Dizziness   HPI Eric Hanna is a 71 y.o. male with a history of CAD, borderline diabetes, hypertension who presents for evaluation of imbalance. Patient reports for the last 2 days he has been having difficulty with balance while walking. Every time he stands up he feels that both his legs are weak and he is been having trouble with his balance. He feels like he is on a boat. No vertigo, no lightheadedness, no unilateral weakness or numbness, no headache, no changes in vision, no facial droop, no slurred speech. The symptoms have intermittent and moderate in intensity.Today he went to urgent care and he was noted to be walking leaning towards the right tendon was sent here for evaluation. No personal or family history of stroke. Patient is not on any blood thinners.  Past Medical History:  Diagnosis Date  . Coronary artery disease   . DDD (degenerative disc disease), cervical   . Depression   . Diabetes mellitus without complication ([place])    borderline; no meds; no blood sugar checks  . Diarrhea   . Hypertension   . Hypertrophy of prostate   . Impotence, organic   . Lower urinary tract symptoms (LUTS)   . Lumbar trigger point syndrome   . Malignant neoplasm of bladder ([place])   . Myocardial infarction ([place]) 2000  . Obesity   . OSA (obstructive sleep apnea)   . Osteoarthritis   . Sleep apnea   . Spondylolisthesis of cervical region   . Testicular hypofunction   . WPW (Wolff-Parkinson-White syndrome)     There are no active problems to display for this patient.   Past Surgical History:  Procedure Laterality Date  . CARDIAC CATHETERIZATION    . CHIN IMPLANT     . COLONOSCOPY    . COLONOSCOPY WITH PROPOFOL N/A 09/07/2015   Procedure: COLONOSCOPY WITH PROPOFOL;  Surgeon: Manya Silvas, MD;  Location: [facility] ENDOSCOPY;  Service: Endoscopy;  Laterality: N/A;  . HAND SURGERY    . STOMACH REDUCTION WITHOUT BYPASS    . X-STOP IMPLANTATION      Prior to Admission medications   Medication Sig Start Date End Date Taking? Authorizing Provider  alendronate (FOSAMAX) 70 MG tablet Take 70 mg by mouth once a week. Take with a full glass of water on an empty stomach.   Yes [provider]  aspirin 81 MG tablet Take 81 mg by mouth daily.   Yes [provider]  Calcium Carbonate-Vitamin D 600-400 MG-UNIT tablet Take 1 tablet by mouth daily.   Yes [provider]  cetirizine (ZYRTEC) 10 MG tablet Take 10 mg by mouth daily.   Yes [provider]  COENZYME Q10 PO Take 1 capsule by mouth daily.   Yes [provider]  metoprolol succinate (TOPROL-XL) 50 MG 24 hr tablet Take 50 mg by mouth daily. Take with or immediately following a meal.   Yes [provider]  Multiple Vitamin (MULTIVITAMIN) capsule Take 1 capsule by mouth daily.   Yes [provider]  omeprazole (PRILOSEC) 20 MG capsule Take 20 mg by mouth daily.   Yes [provider]  sildenafil (VIAGRA) 100 MG tablet Take 100 mg by mouth daily as needed for erectile dysfunction.   Yes [provider]  simvastatin (ZOCOR) 80 MG tablet  Take 80 mg by mouth daily.   Yes [provider]  Testosterone 75 MG PLLT 75 mg by Implant route. Every 4-6 months.   Yes [provider]  traZODone (DESYREL) 50 MG tablet Take 50 mg by mouth at bedtime. 11/12/16  Yes [provider]  diazepam (VALIUM) 5 MG tablet Take 1 tablet (5 mg total) by mouth every 8 (eight) hours as needed (dizziness). 12/27/16 12/27/17  Rudene Re, MD  meclizine (ANTIVERT) 25 MG tablet Take 1 tablet (25 mg total) by mouth 3 (three) times daily as needed for dizziness. 12/27/16   Rudene Re, MD     Allergies Patient has no known allergies.  No family history on file.  Social History Social History  Substance Use Topics  . Smoking status: Former Research scientist (life sciences)  . Smokeless tobacco: Not on file  . Alcohol use No    Review of Systems  Constitutional: Negative for fever. Eyes: Negative for visual changes. ENT: Negative for sore throat. Neck: No neck pain  Cardiovascular: Negative for chest pain. Respiratory: Negative for shortness of breath. Gastrointestinal: Negative for abdominal pain, vomiting or diarrhea. Genitourinary: Negative for dysuria. Musculoskeletal: Negative for back pain. Skin: Negative for rash. Neurological: Negative for headaches, weakness or numbness. + imbalance Psych: No SI or HI  ____________________________________________   PHYSICAL EXAM:  VITAL SIGNS: ED Triage Vitals  Enc Vitals Group     BP 12/27/16 0907 129/80     Pulse Rate 12/27/16 0907 62     Resp 12/27/16 0907 18     Temp 12/27/16 0907 97.9 F (36.6 C)     Temp Source 12/27/16 0907 Oral     SpO2 12/27/16 0907 98 %     Weight 12/27/16 0908 242 lb (109.8 kg)     Height 12/27/16 0908 6\' 1"  (1.854 m)     Head Circumference --      Peak Flow --      Pain Score --      Pain Loc --      Pain Edu? --      Excl. in [place]? --     Constitutional: Alert and oriented. Well appearing and in no apparent distress. HEENT:      Head: Normocephalic and atraumatic.         Eyes: Conjunctivae are normal. Sclera is non-icteric.       Mouth/Throat: Mucous membranes are moist.       Neck: Supple with no signs of meningismus. Cardiovascular: Regular rate and rhythm. No murmurs, gallops, or rubs. 2+ symmetrical distal pulses are present in all extremities. No JVD. Respiratory: Normal respiratory effort. Lungs are clear to auscultation bilaterally. No wheezes, crackles, or rhonchi.  Gastrointestinal: Soft, non tender, and non distended with positive bowel sounds. No rebound or guarding. Musculoskeletal:  Nontender with normal range of motion in all extremities. No edema, cyanosis, or erythema of extremities. Neurologic: Normal speech and language. A & O x3, PERRL, no nystagmus, CN II-XII intact, motor testing reveals good tone and bulk throughout. There is no evidence of pronator drift or dysmetria. Muscle strength is 5/5 throughout. Deep tendon reflexes are 2+ throughout with downgoing toes. Sensory examination is intact. Gait is normal although patient did have a very brief episode of leaning to the L which resolved in a few seconds Skin: Skin is warm, dry and intact. No rash noted. Psychiatric: Mood and affect are normal. Speech and behavior are normal.  ____________________________________________   LABS (all labs ordered are listed, but only abnormal  results are displayed)  Labs Reviewed  CBC WITH DIFFERENTIAL/PLATELET - Abnormal; Notable for the following:       Result Value   Platelets 137 (*)    All other components within normal limits  BASIC METABOLIC PANEL - Abnormal; Notable for the following:    Glucose, Bld 135 (*)    BUN 25 (*)    All other components within normal limits  URINALYSIS, COMPLETE (UACMP) WITH MICROSCOPIC - Abnormal; Notable for the following:    Color, Urine YELLOW (*)    APPearance CLEAR (*)    All other components within normal limits  TROPONIN I   ____________________________________________  EKG  ED ECG REPORT I, Rudene Re, the attending physician, personally viewed and interpreted this ECG.  Sinus bradycardia, rate of 57, first-degree AV block, prolonged QRS, normal QTC, normal axis, no ST elevations or depressions. ____________________________________________  RADIOLOGY  Head CT: Brain atrophy and chronic white matter microvascular ischemic changes. No acute intracranial abnormality by noncontrast CT.   MRI brain: Negative noncontrast MRI of the head for age. ____________________________________________   PROCEDURES  Procedure(s)  performed: None Procedures Critical Care performed:  None ____________________________________________   INITIAL IMPRESSION / ASSESSMENT AND PLAN / ED COURSE   71 y.o. male with a history of CAD, borderline diabetes, hypertension who presents for evaluation of imbalance. Patient neurologically intact with slight gait instability for a second noted on exam. Based n patient's PMH and description of symptoms, I am concerned about a stroke. CT head will be done and if negative will pursue MRI. Will check labs for dehydration, electrolyte abnormalities. Will check orthostatic.    _________________________ 3:05 PM on 12/27/2016 -----------------------------------------  Orthostatics negative. Blood work negative. UA with no UTI. Head CTwith no acute stroke. MRI also negative. Patient reports that the symptoms were becoming more pronounced describing as feeling off balance every time that he stood up in the room. Patient remained neurologically intact. I consulted neurology and patient was evaluated by Dr. Doy Mince who believes patient's symptoms are due to peripheral vertigo. Patient was given meclizine, ambulating with no difficulty. I am referring patient to ENT for further evaluation. Patient provided with prescription for valium and meclizine. Patient already on ASA for stroke prevention and encouraged to conitnue it. Discussed signs and symptoms of stroke with patient and recommended return to the ER if these develop. Patient comfortable with the plan.  Pertinent labs & imaging results that were available during my care of the patient were reviewed by me and considered in my medical decision making (see chart for details).    ____________________________________________   FINAL CLINICAL IMPRESSION(S) / ED DIAGNOSES  Final diagnoses:  Dizziness      NEW MEDICATIONS STARTED DURING THIS VISIT:  New Prescriptions   DIAZEPAM (VALIUM) 5 MG TABLET    Take 1 tablet (5 mg total) by mouth  every 8 (eight) hours as needed (dizziness).   MECLIZINE (ANTIVERT) 25 MG TABLET    Take 1 tablet (25 mg total) by mouth 3 (three) times daily as needed for dizziness.     Note:  This document was prepared using Dragon voice recognition software and may include unintentional dictation errors.    Alfred Levins, [place], MD 12/27/16 [phone]

## 2016-12-27 NOTE — ED Notes (Signed)
Patient transported to MR by EDT [place].

## 2016-12-27 NOTE — ED Notes (Signed)
Pt unable to urinate at this time; will try to give [place] specimen when he returns from CT.

## 2016-12-27 NOTE — Discharge Instructions (Signed)
Return to the emergency room for facial droop, slurred speech, difficulty finding words, headache, unilateral weakness or numbness. Otherwise follow up with ear nose and throat within a week. Do not take Valium if you're drinking, driving, or operating heavy machinery as it can cause sedation.

## 2016-12-27 NOTE — ED Notes (Signed)
Neurologist at bedside. 

## 2016-12-27 NOTE — ED Notes (Signed)
Pt spoke with MRI

## 2016-12-27 NOTE — ED Notes (Signed)
Pt given sandwich tray and diet soda, per request, after passing the swallow screen.

## 2016-12-27 NOTE — ED Notes (Signed)
Patient transported to CT 

## 2016-12-27 NOTE — ED Notes (Signed)
Return from MRI.  AAOx3.  Skin warm and dry. NAD 

## 2016-12-27 NOTE — ED Notes (Signed)
Pt verbalized understanding of discharge instructions and states he "feels better already." Pt stable upon discharge, not currently dizzy and will make f/u appt when he leaves.

## 2017-05-09 ENCOUNTER — Telehealth: Payer: Self-pay | Admitting: Urology

## 2017-05-09 DIAGNOSIS — E291 Testicular hypofunction: Secondary | ICD-10-CM

## 2017-05-09 NOTE — Telephone Encounter (Signed)
Spoke with pt in reference to testopel and cysto appts. Per Dr. Dene Gentry note from 01/2017 pt is due for testosterone labs now. And according to 09/2016 note pt is due for annual cysto in May. Made pt aware of both dictations. Pt voiced understanding. Lab appt made for tomorrow and will proceed accordingly. Pt voiced understanding.

## 2017-05-09 NOTE — Telephone Encounter (Signed)
Pt called and thinks he is scheduled for cysto in April or May and also testopel implants.  Can you research Care Everywhere and let him know when these dates are?  [phone]

## 2017-05-10 ENCOUNTER — Other Ambulatory Visit: Payer: Self-pay

## 2017-05-10 DIAGNOSIS — E291 Testicular hypofunction: Secondary | ICD-10-CM

## 2017-05-11 LAB — HEMATOCRIT: Hematocrit: 52.6 % — ABNORMAL HIGH (ref 37.5–51.0)

## 2017-05-11 LAB — TESTOSTERONE: Testosterone: 371 ng/dL (ref 264–916)

## 2017-05-14 ENCOUNTER — Telehealth: Payer: Self-pay

## 2017-05-14 NOTE — Telephone Encounter (Signed)
-----   Message from Abbie Sons, MD sent at 05/14/2017  7:38 AM EST ----- Hematocrit is elevated at 52.6.  Is he donating blood?

## 2017-05-14 NOTE — Telephone Encounter (Signed)
Spoke with pt wife in reference to pt hct and donating blood. Wife stated that pt has not donated. Reinforced with wife the need to donate. Wife voiced understanding.

## 2017-05-30 ENCOUNTER — Encounter: Payer: Self-pay | Admitting: *Deleted

## 2017-05-30 ENCOUNTER — Other Ambulatory Visit: Payer: Self-pay

## 2017-06-04 NOTE — Discharge Instructions (Signed)

## 2017-06-05 ENCOUNTER — Ambulatory Visit
Admission: RE | Admit: 2017-06-05 | Discharge: 2017-06-05 | Disposition: A | Discharge status: Home / Self Care | Payer: Medicare Other | Source: Ambulatory Visit | Attending: Ophthalmology | Admitting: Ophthalmology

## 2017-06-05 ENCOUNTER — Encounter
Admission: RE | Disposition: A | Discharge status: Home / Self Care | Payer: Self-pay | Source: Ambulatory Visit | Attending: Ophthalmology

## 2017-06-05 ENCOUNTER — Ambulatory Visit: Payer: Medicare Other | Admitting: Anesthesiology

## 2017-06-05 DIAGNOSIS — M810 Age-related osteoporosis without current pathological fracture: Secondary | ICD-10-CM | POA: Diagnosis not present

## 2017-06-05 DIAGNOSIS — Z9884 Bariatric surgery status: Secondary | ICD-10-CM | POA: Insufficient documentation

## 2017-06-05 DIAGNOSIS — Z9989 Dependence on other enabling machines and devices: Secondary | ICD-10-CM | POA: Insufficient documentation

## 2017-06-05 DIAGNOSIS — G4730 Sleep apnea, unspecified: Secondary | ICD-10-CM | POA: Insufficient documentation

## 2017-06-05 DIAGNOSIS — E7800 Pure hypercholesterolemia, unspecified: Secondary | ICD-10-CM | POA: Diagnosis not present

## 2017-06-05 DIAGNOSIS — I2510 Atherosclerotic heart disease of native coronary artery without angina pectoris: Secondary | ICD-10-CM | POA: Insufficient documentation

## 2017-06-05 DIAGNOSIS — K219 Gastro-esophageal reflux disease without esophagitis: Secondary | ICD-10-CM | POA: Insufficient documentation

## 2017-06-05 DIAGNOSIS — I10 Essential (primary) hypertension: Secondary | ICD-10-CM | POA: Diagnosis not present

## 2017-06-05 DIAGNOSIS — J45909 Unspecified asthma, uncomplicated: Secondary | ICD-10-CM | POA: Diagnosis not present

## 2017-06-05 DIAGNOSIS — I252 Old myocardial infarction: Secondary | ICD-10-CM | POA: Diagnosis not present

## 2017-06-05 DIAGNOSIS — Z8551 Personal history of malignant neoplasm of bladder: Secondary | ICD-10-CM | POA: Diagnosis not present

## 2017-06-05 DIAGNOSIS — H2512 Age-related nuclear cataract, left eye: Secondary | ICD-10-CM | POA: Insufficient documentation

## 2017-06-05 DIAGNOSIS — M1990 Unspecified osteoarthritis, unspecified site: Secondary | ICD-10-CM | POA: Diagnosis not present

## 2017-06-05 DIAGNOSIS — Z87891 Personal history of nicotine dependence: Secondary | ICD-10-CM | POA: Insufficient documentation

## 2017-06-05 HISTORY — DX: Gastro-esophageal reflux disease without esophagitis: K21.9

## 2017-06-05 HISTORY — PX: CATARACT EXTRACTION W/PHACO: SHX586

## 2017-06-05 SURGERY — PHACOEMULSIFICATION, CATARACT, WITH IOL INSERTION
Anesthesia: General | Laterality: Left | Wound class: Clean

## 2017-06-05 MED ORDER — BSS IO SOLN
INTRAOCULAR | Status: DC | PRN
  Administered 2017-06-05: 62 mL via OPHTHALMIC

## 2017-06-05 MED ORDER — CEFUROXIME OPHTHALMIC INJECTION 1 MG/0.1 ML
INJECTION | OPHTHALMIC | Status: DC | PRN
  Administered 2017-06-05: 0.1 mL via INTRACAMERAL

## 2017-06-05 MED ORDER — NA HYALUR & NA CHOND-NA HYALUR 0.4-0.35 ML IO KIT
PACK | INTRAOCULAR | Status: DC | PRN
  Administered 2017-06-05: 1 mL via INTRAOCULAR

## 2017-06-05 MED ORDER — MOXIFLOXACIN HCL 0.5 % OP SOLN
1.0000 [drp] | OPHTHALMIC | Status: DC | PRN
  Administered 2017-06-05 (×3): 1 [drp] via OPHTHALMIC

## 2017-06-05 MED ORDER — OXYCODONE HCL 5 MG PO TABS: 5.0000 mg | ORAL_TABLET | Freq: Once | ORAL | Status: DC | PRN

## 2017-06-05 MED ORDER — OXYCODONE HCL 5 MG/5ML PO SOLN: 5.0000 mg | Freq: Once | ORAL | Status: DC | PRN

## 2017-06-05 MED ORDER — MIDAZOLAM HCL 2 MG/2ML IJ SOLN
INTRAMUSCULAR | Status: DC | PRN
  Administered 2017-06-05: 2 mg via INTRAVENOUS

## 2017-06-05 MED ORDER — LACTATED RINGERS IV SOLN: 10.0000 mL/h | INTRAVENOUS | Status: DC

## 2017-06-05 MED ORDER — FENTANYL CITRATE (PF) 100 MCG/2ML IJ SOLN
INTRAMUSCULAR | Status: DC | PRN
  Administered 2017-06-05: 50 ug via INTRAVENOUS

## 2017-06-05 MED ORDER — BRIMONIDINE TARTRATE-TIMOLOL 0.2-0.5 % OP SOLN
OPHTHALMIC | Status: DC | PRN
  Administered 2017-06-05: 1 [drp] via OPHTHALMIC

## 2017-06-05 MED ORDER — MEPERIDINE HCL 25 MG/ML IJ SOLN: 6.2500 mg | INTRAMUSCULAR | Status: DC | PRN

## 2017-06-05 MED ORDER — BALANCED SALT IO SOLN
INTRAOCULAR | Status: DC | PRN
  Administered 2017-06-05: 1 mL via INTRAMUSCULAR

## 2017-06-05 MED ORDER — ARMC OPHTHALMIC DILATING DROPS
1.0000 "application " | OPHTHALMIC | Status: DC | PRN
  Administered 2017-06-05 (×3): 1 via OPHTHALMIC

## 2017-06-05 MED ORDER — FENTANYL CITRATE (PF) 100 MCG/2ML IJ SOLN: 25.0000 ug | INTRAMUSCULAR | Status: DC | PRN

## 2017-06-05 MED ORDER — PROMETHAZINE HCL 25 MG/ML IJ SOLN: 6.2500 mg | INTRAMUSCULAR | Status: DC | PRN

## 2017-06-05 SURGICAL SUPPLY — 27 items
CANNULA ANT/CHMB 27G (MISCELLANEOUS) ×1 IMPLANT
CANNULA ANT/CHMB 27GA (MISCELLANEOUS) ×3 IMPLANT
CARTRIDGE ABBOTT (MISCELLANEOUS) IMPLANT
GLOVE SURG LX 7.5 STRW (GLOVE) ×2
GLOVE SURG LX STRL 7.5 STRW (GLOVE) ×1 IMPLANT
GLOVE SURG TRIUMPH 8.0 PF LTX (GLOVE) ×3 IMPLANT
GOWN STRL REUS W/ TWL LRG LVL3 (GOWN DISPOSABLE) ×2 IMPLANT
GOWN STRL REUS W/TWL LRG LVL3 (GOWN DISPOSABLE) ×6
LENS IOL TECNIS ITEC 23.5 (Intraocular Lens) ×2 IMPLANT
MARKER SKIN DUAL TIP RULER LAB (MISCELLANEOUS) ×3 IMPLANT
NDL FILTER BLUNT 18X1 1/2 (NEEDLE) ×1 IMPLANT
NDL RETROBULBAR .5 NSTRL (NEEDLE) IMPLANT
NEEDLE FILTER BLUNT 18X 1/2SAF (NEEDLE) ×2
NEEDLE FILTER BLUNT 18X1 1/2 (NEEDLE) ×1 IMPLANT
PACK CATARACT BRASINGTON (MISCELLANEOUS) ×3 IMPLANT
PACK EYE AFTER SURG (MISCELLANEOUS) ×3 IMPLANT
PACK OPTHALMIC (MISCELLANEOUS) ×3 IMPLANT
RING MALYGIN 7.0 (MISCELLANEOUS) IMPLANT
SUT ETHILON 10-0 CS-B-6CS-B-6 (SUTURE)
SUT VICRYL  9 0 (SUTURE)
SUT VICRYL 9 0 (SUTURE) IMPLANT
SUTURE EHLN 10-0 CS-B-6CS-B-6 (SUTURE) IMPLANT
SYR 3ML LL SCALE MARK (SYRINGE) ×3 IMPLANT
SYR 5ML LL (SYRINGE) ×3 IMPLANT
SYR TB 1ML LUER SLIP (SYRINGE) ×3 IMPLANT
WATER STERILE IRR 500ML POUR (IV SOLUTION) ×3 IMPLANT
WIPE NON LINTING 3.25X3.25 (MISCELLANEOUS) ×3 IMPLANT

## 2017-06-05 NOTE — Op Note (Signed)
OPERATIVE NOTE  STAFFORD RIVIERA [id number] 06/05/2017   PREOPERATIVE DIAGNOSIS:  Nuclear sclerotic cataract left eye. H25.12   POSTOPERATIVE DIAGNOSIS:    Nuclear sclerotic cataract left eye.     PROCEDURE:  Phacoemusification with posterior chamber intraocular lens placement of the left eye   LENS:   Implant Name Type Inv. Item Serial No. Manufacturer Lot No. LRB No. Used  LENS IOL DIOP 23.5 - [id number] Intraocular Lens LENS IOL DIOP 23.5 [id number] AMO  Left 1        ULTRASOUND TIME: 13  % of 1 minutes 8 seconds, CDE 9.1  SURGEON:  Wyonia Hough, MD   ANESTHESIA:  Topical with tetracaine drops and 2% Xylocaine jelly, augmented with 1% preservative-free intracameral lidocaine.    COMPLICATIONS:  None.   DESCRIPTION OF PROCEDURE:  The patient was identified in the holding room and transported to the operating room and placed in the supine position under the operating microscope.  The left eye was identified as the operative eye and it was prepped and draped in the usual sterile ophthalmic fashion.   A 1 millimeter clear-corneal paracentesis was made at the 1:30 position.  0.5 ml of preservative-free 1% lidocaine was injected into the anterior chamber.  The anterior chamber was filled with Viscoat viscoelastic.  A 2.4 millimeter keratome was used to make a near-clear corneal incision at the 10:30 position.  .  A curvilinear capsulorrhexis was made with a cystotome and capsulorrhexis forceps.  Balanced salt solution was used to hydrodissect and hydrodelineate the nucleus.   Phacoemulsification was then used in stop and chop fashion to remove the lens nucleus and epinucleus.  The remaining cortex was then removed using the irrigation and aspiration handpiece. Provisc was then placed into the capsular bag to distend it for lens placement.  A lens was then injected into the capsular bag.  The remaining viscoelastic was aspirated.   Wounds were hydrated with balanced salt  solution.  The anterior chamber was inflated to a physiologic pressure with balanced salt solution.  No wound leaks were noted. Cefuroxime 0.1 ml of a 10mg /ml solution was injected into the anterior chamber for a dose of 1 mg of intracameral antibiotic at the completion of the case.   Timolol and Brimonidine drops were applied to the eye.  The patient was taken to the recovery room in stable condition without complications of anesthesia or surgery.  Shayley Medlin 06/05/2017, 10:57 AM

## 2017-06-05 NOTE — Anesthesia Preprocedure Evaluation (Addendum)
Anesthesia Evaluation  Patient identified by MRN, date of birth, ID band Patient awake    Reviewed: Allergy & Precautions, H&P , NPO status , Patient's Chart, lab work & pertinent test results, reviewed documented beta blocker date and time   Airway Mallampati: II  TM Distance: >3 FB Neck ROM: full    Dental no notable dental hx.    Pulmonary sleep apnea , former smoker,    Pulmonary exam normal breath sounds clear to auscultation       Cardiovascular Exercise Tolerance: Good hypertension, + CAD and + Past MI   Rhythm:regular Rate:Normal  WPW   Neuro/Psych PSYCHIATRIC DISORDERS negative neurological ROS     GI/Hepatic Neg liver ROS, GERD  ,  Endo/Other  negative endocrine ROSdiabetes  Renal/GU negative Renal ROS  negative genitourinary   Musculoskeletal   Abdominal   Peds  Hematology negative hematology ROS (+)   Anesthesia Other Findings   Reproductive/Obstetrics negative OB ROS                             Anesthesia Physical Anesthesia Plan  ASA: III  Anesthesia Plan: MAC   Post-op Pain Management:    Induction:   PONV Risk Score and Plan:   Airway Management Planned:   Additional Equipment:   Intra-op Plan:   Post-operative Plan:   Informed Consent: I have reviewed the patients History and Physical, chart, labs and discussed the procedure including the risks, benefits and alternatives for the proposed anesthesia with the patient or authorized representative who has indicated his/her understanding and acceptance.   Dental Advisory Given  Plan Discussed with: CRNA  Anesthesia Plan Comments:        Anesthesia Quick Evaluation

## 2017-06-05 NOTE — H&P (Signed)
The History and Physical notes are on paper, have been signed, and are to be scanned. The patient remains stable and unchanged from the H&P.   Previous H&P reviewed, patient examined, and there are no changes.  Rulon Abdalla 06/05/2017 10:12 AM

## 2017-06-05 NOTE — Anesthesia Procedure Notes (Signed)
Procedure Name: MAC Performed by: Enola Siebers, CRNA Pre-anesthesia Checklist: Patient identified, Emergency Drugs available, Suction available, Timeout performed and Patient being monitored Patient Re-evaluated:Patient Re-evaluated prior to induction Oxygen Delivery Method: Nasal cannula Placement Confirmation: positive ETCO2       

## 2017-06-05 NOTE — Transfer of Care (Signed)
Immediate Anesthesia Transfer of Care Note  Patient: Eric Hanna  Procedure(s) Performed: CATARACT EXTRACTION PHACO AND INTRAOCULAR LENS PLACEMENT (IOC) LEFT (Left )  Patient Location: PACU  Anesthesia Type: General  Level of Consciousness: awake, alert  and patient cooperative  Airway and Oxygen Therapy: Patient Spontanous Breathing and Patient connected to supplemental oxygen  Post-op Assessment: Post-op Vital signs reviewed, Patient's Cardiovascular Status Stable, Respiratory Function Stable, Patent Airway and No signs of Nausea or vomiting  Post-op Vital Signs: Reviewed and stable  Complications: No apparent anesthesia complications

## 2017-06-05 NOTE — Anesthesia Postprocedure Evaluation (Signed)
Anesthesia Post Note  Patient: Eric Hanna  Procedure(s) Performed: CATARACT EXTRACTION PHACO AND INTRAOCULAR LENS PLACEMENT (IOC) LEFT (Left )  Patient location during evaluation: PACU Anesthesia Type: General Level of consciousness: awake and alert Pain management: pain level controlled Vital Signs Assessment: post-procedure vital signs reviewed and stable Respiratory status: spontaneous breathing, nonlabored ventilation, respiratory function stable and patient connected to nasal cannula oxygen Cardiovascular status: stable and blood pressure returned to baseline Postop Assessment: no apparent nausea or vomiting Anesthetic complications: no    SCOURAS, NICOLE ELAINE

## 2017-06-25 DIAGNOSIS — Z0000 Encounter for general adult medical examination without abnormal findings: Secondary | ICD-10-CM | POA: Insufficient documentation

## 2017-06-27 ENCOUNTER — Encounter: Payer: Self-pay | Admitting: *Deleted

## 2017-06-27 ENCOUNTER — Other Ambulatory Visit: Payer: Self-pay

## 2017-07-01 NOTE — Discharge Instructions (Signed)

## 2017-07-03 ENCOUNTER — Ambulatory Visit: Payer: Medicare Other | Admitting: Anesthesiology

## 2017-07-03 ENCOUNTER — Ambulatory Visit
Admission: RE | Admit: 2017-07-03 | Discharge: 2017-07-03 | Disposition: A | Discharge status: Home / Self Care | Payer: Medicare Other | Source: Ambulatory Visit | Attending: Ophthalmology | Admitting: Ophthalmology

## 2017-07-03 ENCOUNTER — Encounter
Admission: RE | Disposition: A | Discharge status: Home / Self Care | Payer: Self-pay | Source: Ambulatory Visit | Attending: Ophthalmology

## 2017-07-03 DIAGNOSIS — G4730 Sleep apnea, unspecified: Secondary | ICD-10-CM | POA: Diagnosis not present

## 2017-07-03 DIAGNOSIS — Z9884 Bariatric surgery status: Secondary | ICD-10-CM | POA: Diagnosis not present

## 2017-07-03 DIAGNOSIS — Z8551 Personal history of malignant neoplasm of bladder: Secondary | ICD-10-CM | POA: Insufficient documentation

## 2017-07-03 DIAGNOSIS — Z87891 Personal history of nicotine dependence: Secondary | ICD-10-CM | POA: Insufficient documentation

## 2017-07-03 DIAGNOSIS — I10 Essential (primary) hypertension: Secondary | ICD-10-CM | POA: Diagnosis not present

## 2017-07-03 DIAGNOSIS — Z79899 Other long term (current) drug therapy: Secondary | ICD-10-CM | POA: Insufficient documentation

## 2017-07-03 DIAGNOSIS — I2510 Atherosclerotic heart disease of native coronary artery without angina pectoris: Secondary | ICD-10-CM | POA: Insufficient documentation

## 2017-07-03 DIAGNOSIS — H2511 Age-related nuclear cataract, right eye: Secondary | ICD-10-CM | POA: Insufficient documentation

## 2017-07-03 DIAGNOSIS — M810 Age-related osteoporosis without current pathological fracture: Secondary | ICD-10-CM | POA: Insufficient documentation

## 2017-07-03 DIAGNOSIS — I252 Old myocardial infarction: Secondary | ICD-10-CM | POA: Diagnosis not present

## 2017-07-03 DIAGNOSIS — I456 Pre-excitation syndrome: Secondary | ICD-10-CM | POA: Insufficient documentation

## 2017-07-03 HISTORY — PX: CATARACT EXTRACTION W/PHACO: SHX586

## 2017-07-03 SURGERY — PHACOEMULSIFICATION, CATARACT, WITH IOL INSERTION
Anesthesia: Monitor Anesthesia Care | Site: Eye | Laterality: Right | Wound class: Clean

## 2017-07-03 MED ORDER — NA HYALUR & NA CHOND-NA HYALUR 0.4-0.35 ML IO KIT
PACK | INTRAOCULAR | Status: DC | PRN
  Administered 2017-07-03: 1 mL via INTRAOCULAR

## 2017-07-03 MED ORDER — EPINEPHRINE PF 1 MG/ML IJ SOLN
INTRAMUSCULAR | Status: DC | PRN
  Administered 2017-07-03: 62 mL via OPHTHALMIC

## 2017-07-03 MED ORDER — LACTATED RINGERS IV SOLN: INTRAVENOUS | Status: DC

## 2017-07-03 MED ORDER — CEFUROXIME OPHTHALMIC INJECTION 1 MG/0.1 ML
INJECTION | OPHTHALMIC | Status: DC | PRN
  Administered 2017-07-03: .3 mL via OPHTHALMIC

## 2017-07-03 MED ORDER — ACETAMINOPHEN 325 MG PO TABS: 650.0000 mg | ORAL_TABLET | Freq: Once | ORAL | Status: DC | PRN

## 2017-07-03 MED ORDER — ARMC OPHTHALMIC DILATING DROPS
1.0000 "application " | OPHTHALMIC | Status: DC | PRN
  Administered 2017-07-03 (×3): 1 via OPHTHALMIC

## 2017-07-03 MED ORDER — ACETAMINOPHEN 160 MG/5ML PO SOLN: 325.0000 mg | ORAL | Status: DC | PRN

## 2017-07-03 MED ORDER — FENTANYL CITRATE (PF) 100 MCG/2ML IJ SOLN
INTRAMUSCULAR | Status: DC | PRN
  Administered 2017-07-03: 50 ug via INTRAVENOUS

## 2017-07-03 MED ORDER — MOXIFLOXACIN HCL 0.5 % OP SOLN
1.0000 [drp] | OPHTHALMIC | Status: DC | PRN
  Administered 2017-07-03 (×3): 1 [drp] via OPHTHALMIC

## 2017-07-03 MED ORDER — ONDANSETRON HCL 4 MG/2ML IJ SOLN: 4.0000 mg | Freq: Once | INTRAMUSCULAR | Status: DC | PRN

## 2017-07-03 MED ORDER — BRIMONIDINE TARTRATE-TIMOLOL 0.2-0.5 % OP SOLN
OPHTHALMIC | Status: DC | PRN
  Administered 2017-07-03: 1 [drp] via OPHTHALMIC

## 2017-07-03 MED ORDER — MIDAZOLAM HCL 2 MG/2ML IJ SOLN
INTRAMUSCULAR | Status: DC | PRN
  Administered 2017-07-03: 2 mg via INTRAVENOUS

## 2017-07-03 MED ORDER — LIDOCAINE HCL (PF) 2 % IJ SOLN
INTRAOCULAR | Status: DC | PRN
  Administered 2017-07-03: 1 mL via INTRAMUSCULAR

## 2017-07-03 SURGICAL SUPPLY — 27 items
CANNULA ANT/CHMB 27G (MISCELLANEOUS) ×1 IMPLANT
CANNULA ANT/CHMB 27GA (MISCELLANEOUS) ×3 IMPLANT
CARTRIDGE ABBOTT (MISCELLANEOUS) IMPLANT
GLOVE SURG LX 7.5 STRW (GLOVE) ×2
GLOVE SURG LX STRL 7.5 STRW (GLOVE) ×1 IMPLANT
GLOVE SURG TRIUMPH 8.0 PF LTX (GLOVE) ×3 IMPLANT
GOWN STRL REUS W/ TWL LRG LVL3 (GOWN DISPOSABLE) ×2 IMPLANT
GOWN STRL REUS W/TWL LRG LVL3 (GOWN DISPOSABLE) ×6
LENS IOL TECNIS ITEC 20.5 (Intraocular Lens) ×2 IMPLANT
MARKER SKIN DUAL TIP RULER LAB (MISCELLANEOUS) ×3 IMPLANT
NDL FILTER BLUNT 18X1 1/2 (NEEDLE) ×1 IMPLANT
NDL RETROBULBAR .5 NSTRL (NEEDLE) IMPLANT
NEEDLE FILTER BLUNT 18X 1/2SAF (NEEDLE) ×2
NEEDLE FILTER BLUNT 18X1 1/2 (NEEDLE) ×1 IMPLANT
PACK CATARACT BRASINGTON (MISCELLANEOUS) ×3 IMPLANT
PACK EYE AFTER SURG (MISCELLANEOUS) ×3 IMPLANT
PACK OPTHALMIC (MISCELLANEOUS) ×3 IMPLANT
RING MALYGIN 7.0 (MISCELLANEOUS) IMPLANT
SUT ETHILON 10-0 CS-B-6CS-B-6 (SUTURE)
SUT VICRYL  9 0 (SUTURE)
SUT VICRYL 9 0 (SUTURE) IMPLANT
SUTURE EHLN 10-0 CS-B-6CS-B-6 (SUTURE) IMPLANT
SYR 3ML LL SCALE MARK (SYRINGE) ×3 IMPLANT
SYR 5ML LL (SYRINGE) ×3 IMPLANT
SYR TB 1ML LUER SLIP (SYRINGE) ×3 IMPLANT
WATER STERILE IRR 500ML POUR (IV SOLUTION) ×3 IMPLANT
WIPE NON LINTING 3.25X3.25 (MISCELLANEOUS) ×3 IMPLANT

## 2017-07-03 NOTE — Anesthesia Postprocedure Evaluation (Signed)
Anesthesia Post Note  Patient: Eric Hanna  Procedure(s) Performed: CATARACT EXTRACTION PHACO AND INTRAOCULAR LENS PLACEMENT (IOC) RIGHT (Right Eye)  Patient location during evaluation: PACU Anesthesia Type: MAC Level of consciousness: awake and alert, oriented and patient cooperative Pain management: pain level controlled Vital Signs Assessment: post-procedure vital signs reviewed and stable Respiratory status: spontaneous breathing, nonlabored ventilation and respiratory function stable Cardiovascular status: blood pressure returned to baseline and stable Postop Assessment: adequate PO intake Anesthetic complications: no    Darrin Nipper

## 2017-07-03 NOTE — Anesthesia Preprocedure Evaluation (Signed)
Anesthesia Evaluation  Patient identified by MRN, date of birth, ID band Patient awake    Reviewed: Allergy & Precautions, NPO status , Patient's Chart, lab work & pertinent test results  History of Anesthesia Complications Negative for: history of anesthetic complications  Airway Mallampati: II  TM Distance: >3 FB Neck ROM: Full    Dental no notable dental hx.    Pulmonary sleep apnea and Continuous Positive Airway Pressure Ventilation , former smoker,    Pulmonary exam normal breath sounds clear to auscultation       Cardiovascular Exercise Tolerance: Good hypertension, + CAD and + Past MI (2000)  Normal cardiovascular exam Rhythm:Regular Rate:Normal     Neuro/Psych PSYCHIATRIC DISORDERS Depression    GI/Hepatic GERD  ,  Endo/Other  negative endocrine ROS  Renal/GU Renal disease (hx nephrolithiasis)     Musculoskeletal  (+) Arthritis , Osteoarthritis,    Abdominal   Peds  Hematology Bladder CA   Anesthesia Other Findings   Reproductive/Obstetrics                             Anesthesia Physical Anesthesia Plan  ASA: II  Anesthesia Plan: MAC   Post-op Pain Management:    Induction: Intravenous  PONV Risk Score and Plan: 1 and TIVA and Midazolam  Airway Management Planned: Natural Airway  Additional Equipment:   Intra-op Plan:   Post-operative Plan:   Informed Consent: I have reviewed the patients History and Physical, chart, labs and discussed the procedure including the risks, benefits and alternatives for the proposed anesthesia with the patient or authorized representative who has indicated his/her understanding and acceptance.     Plan Discussed with: CRNA  Anesthesia Plan Comments:         Anesthesia Quick Evaluation

## 2017-07-03 NOTE — Transfer of Care (Signed)
Immediate Anesthesia Transfer of Care Note  Patient: Eric Hanna  Procedure(s) Performed: CATARACT EXTRACTION PHACO AND INTRAOCULAR LENS PLACEMENT (IOC) RIGHT (Right Eye)  Patient Location: PACU  Anesthesia Type: MAC  Level of Consciousness: awake, alert  and patient cooperative  Airway and Oxygen Therapy: Patient Spontanous Breathing and Patient connected to supplemental oxygen  Post-op Assessment: Post-op Vital signs reviewed, Patient's Cardiovascular Status Stable, Respiratory Function Stable, Patent Airway and No signs of Nausea or vomiting  Post-op Vital Signs: Reviewed and stable  Complications: No apparent anesthesia complications

## 2017-07-03 NOTE — H&P (Signed)
The History and Physical notes are on paper, have been signed, and are to be scanned. The patient remains stable and unchanged from the H&P.   Previous H&P reviewed, patient examined, and there are no changes.  Eric Hanna 07/03/2017 12:34 PM

## 2017-07-03 NOTE — Anesthesia Procedure Notes (Signed)
Procedure Name: MAC Date/Time: 07/03/2017 1:57 PM Performed by: Janna Arch, CRNA Pre-anesthesia Checklist: Patient identified, Emergency Drugs available, Suction available and Patient being monitored Patient Re-evaluated:Patient Re-evaluated prior to induction Oxygen Delivery Method: Nasal cannula

## 2017-07-03 NOTE — Op Note (Signed)
LOCATION:  [place]   PREOPERATIVE DIAGNOSIS:    Nuclear sclerotic cataract right eye. H25.11   POSTOPERATIVE DIAGNOSIS:  Nuclear sclerotic cataract right eye.     PROCEDURE:  Phacoemusification with posterior chamber intraocular lens placement of the right eye   LENS:   Implant Name Type Inv. Item Serial No. Manufacturer Lot No. LRB No. Used  LENS IOL DIOP 20.5 - [id number] Intraocular Lens LENS IOL DIOP 20.5 [id number] AMO  Right 1        ULTRASOUND TIME: 17 % of 0 minutes, 51 seconds.  CDE 8.5   SURGEON:  Wyonia Hough, MD   ANESTHESIA:  Topical with tetracaine drops and 2% Xylocaine jelly, augmented with 1% preservative-free intracameral lidocaine.    COMPLICATIONS:  None.   DESCRIPTION OF PROCEDURE:  The patient was identified in the holding room and transported to the operating room and placed in the supine position under the operating microscope.  The right eye was identified as the operative eye and it was prepped and draped in the usual sterile ophthalmic fashion.   A 1 millimeter clear-corneal paracentesis was made at the 12:00 position.  0.5 ml of preservative-free 1% lidocaine was injected into the anterior chamber. The anterior chamber was filled with Viscoat viscoelastic.  A 2.4 millimeter keratome was used to make a near-clear corneal incision at the 9:00 position.  A curvilinear capsulorrhexis was made with a cystotome and capsulorrhexis forceps.  Balanced salt solution was used to hydrodissect and hydrodelineate the nucleus.   Phacoemulsification was then used in stop and chop fashion to remove the lens nucleus and epinucleus.  The remaining cortex was then removed using the irrigation and aspiration handpiece. Provisc was then placed into the capsular bag to distend it for lens placement.  A lens was then injected into the capsular bag.  The remaining viscoelastic was aspirated.   Wounds were hydrated with balanced salt solution.  The anterior  chamber was inflated to a physiologic pressure with balanced salt solution.  No wound leaks were noted. Cefuroxime 0.1 ml of a 10mg /ml solution was injected into the anterior chamber for a dose of 1 mg of intracameral antibiotic at the completion of the case.   Timolol and Brimonidine drops were applied to the eye.  The patient was taken to the recovery room in stable condition without complications of anesthesia or surgery.   Eric Hanna 07/03/2017, 1:14 PM

## 2017-07-17 ENCOUNTER — Telehealth: Payer: Self-pay | Admitting: Urology

## 2017-07-17 ENCOUNTER — Encounter (INDEPENDENT_AMBULATORY_CARE_PROVIDER_SITE_OTHER): Payer: Self-pay

## 2017-07-17 NOTE — Telephone Encounter (Signed)
Pt stopped by, made an appt for office visit.  He is interested in testopel implants.  Please advise.  He hasn't seen you here yet, but you discussed this at [facility].

## 2017-07-18 NOTE — Telephone Encounter (Signed)
He is due for Testopel.  He was getting 22 pellets every 6 months at [facility].  If this is not approved he may want to continue getting at [facility] as 6 will not be enough.

## 2017-07-19 NOTE — Telephone Encounter (Signed)
Pre-certification for Testopel is not required with this patient's insurance (Medicare & BCBS Medicare Supplement).  Ok to proceed with regular course of Testopel.

## 2017-08-01 ENCOUNTER — Encounter: Payer: Self-pay | Admitting: Urology

## 2017-08-01 ENCOUNTER — Ambulatory Visit (INDEPENDENT_AMBULATORY_CARE_PROVIDER_SITE_OTHER): Payer: Medicare Other | Admitting: Urology

## 2017-08-01 VITALS — BP 121/77 | HR 105 | Resp 16 | Ht 74.0 in | Wt 250.2 lb

## 2017-08-01 DIAGNOSIS — E291 Testicular hypofunction: Secondary | ICD-10-CM

## 2017-08-01 MED ORDER — TESTOSTERONE 75 MG IL PLLT
75.0000 mg | PELLET | Freq: Once | Status: AC
  Administered 2017-08-01: 75 mg

## 2017-08-01 NOTE — Progress Notes (Signed)
Procedure note: Testopel  Diagnosis: Hypogonadism  Physician: Abbie Sons, MD  Assistant: None  Anesthetic: 1% plain Xylocaine with epinephrine  Description: A site on the upper outer left buttock was selected and prepped with Betadine and draped in the usual fashion.  The site was anesthetized with 10 mL of the above anesthetic in a fan shape and configuration.  A stab incision was made with 11 blade and spread with forceps.  Using the implantation trocar 6 pellets were placed in 3 rows with 1 row 5 pellets.  Incision was closed with interrupted 5-0 Vicryl.  The site was dressed with gauze and a Tegaderm.  Body weight pressure was placed for 5 minutes.  Complications: None  EBL: Minimal  Discharge instructions: 1.  May shower in 24 hours.  No swimming, tub bath X 1 week. 2.  Call for redness, drainage, increasing pain or fever greater than 101 degrees. 3.  Follow-up testosterone level in 4 months

## 2017-08-05 ENCOUNTER — Encounter: Payer: Self-pay | Admitting: Urology

## 2017-08-07 ENCOUNTER — Encounter: Payer: Self-pay | Admitting: Urology

## 2017-08-09 ENCOUNTER — Other Ambulatory Visit: Payer: Self-pay | Admitting: Urology

## 2017-08-09 MED ORDER — ALPRAZOLAM 1 MG PO TABS: ORAL_TABLET | ORAL | 0 refills | Status: DC

## 2017-08-09 MED ORDER — HYDROCODONE-ACETAMINOPHEN 5-325 MG PO TABS: ORAL_TABLET | ORAL | 0 refills | Status: DC

## 2017-09-04 ENCOUNTER — Encounter: Payer: Self-pay | Admitting: Urology

## 2017-09-04 ENCOUNTER — Ambulatory Visit (INDEPENDENT_AMBULATORY_CARE_PROVIDER_SITE_OTHER): Payer: Medicare Other | Admitting: Urology

## 2017-09-04 VITALS — BP 95/61 | HR 83 | Resp 16 | Ht 74.0 in | Wt 254.1 lb

## 2017-09-04 DIAGNOSIS — Z859 Personal history of malignant neoplasm, unspecified: Secondary | ICD-10-CM | POA: Diagnosis not present

## 2017-09-04 LAB — URINALYSIS, COMPLETE
Bilirubin, UA: NEGATIVE
GLUCOSE, UA: NEGATIVE
Ketones, UA: NEGATIVE
Leukocytes, UA: NEGATIVE
Nitrite, UA: NEGATIVE
PROTEIN UA: NEGATIVE
RBC, UA: NEGATIVE
Specific Gravity, UA: 1.02 (ref 1.005–1.030)
Urobilinogen, Ur: 1 mg/dL (ref 0.2–1.0)
pH, UA: 7.5 (ref 5.0–7.5)

## 2017-09-04 LAB — MICROSCOPIC EXAMINATION: Epithelial Cells (non renal): NONE SEEN /hpf (ref 0–10)

## 2017-09-04 MED ORDER — CIPROFLOXACIN HCL 500 MG PO TABS
500.0000 mg | ORAL_TABLET | Freq: Once | ORAL | Status: DC
Start: 2017-09-04 — End: 2020-01-01

## 2017-09-04 MED ORDER — LIDOCAINE HCL URETHRAL/MUCOSAL 2 % EX GEL
1.0000 | Freq: Once | CUTANEOUS | Status: DC
Start: 2017-09-04 — End: 2023-06-25

## 2017-09-04 NOTE — Progress Notes (Signed)
   09/04/17  CC: No chief complaint on file.   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  He receive post resection mitomycin and has had no recurrences.  Blood pressure 95/61, pulse 83, resp. rate 16, height 6\' 2"  (1.88 m), weight 254 lb 1.6 oz (115.3 kg), SpO2 98 %. NED. A&Ox3.     Cystoscopy Procedure Note  Patient identification was confirmed, informed consent was obtained, and patient was prepped using Betadine solution.  Lidocaine jelly was administered per urethral meatus.    Preoperative abx where received prior to procedure.     Pre-Procedure: - Inspection reveals a normal caliber ureteral meatus.  Procedure: The flexible cystoscope was introduced without difficulty - No urethral strictures/lesions are present. - Lateral lobe enlargement with hypervascularity/friability prostate  - Moderate bladder neck elevation - Bilateral ureteral orifices identified - Bladder mucosa  reveals no ulcers, tumors, or lesions - No bladder stones -Mild trabeculation  Retroflexion shows no abnormalities   Post-Procedure: - Patient tolerated the procedure well  Assessment/ Plan: No evidence of recurrent bladder tumor.  He is 8 years out from low risk urothelial carcinoma without recurrence and discussed shared decision making regarding discontinuing annual surveillance.  He would like to think this over and will discuss further at his next follow-up for hypogonadism.

## 2017-09-12 ENCOUNTER — Encounter: Payer: Self-pay | Admitting: Urology

## 2018-03-11 ENCOUNTER — Encounter: Payer: Self-pay | Admitting: Urology

## 2018-03-12 ENCOUNTER — Encounter: Payer: Self-pay | Admitting: Urology

## 2018-03-18 ENCOUNTER — Other Ambulatory Visit: Payer: Self-pay | Admitting: Family Medicine

## 2018-03-18 ENCOUNTER — Other Ambulatory Visit: Payer: Medicare Other

## 2018-03-18 DIAGNOSIS — Z859 Personal history of malignant neoplasm, unspecified: Secondary | ICD-10-CM

## 2018-03-18 LAB — URINALYSIS, COMPLETE
Bilirubin, UA: NEGATIVE
Glucose, UA: NEGATIVE
KETONES UA: NEGATIVE
LEUKOCYTES UA: NEGATIVE
NITRITE UA: NEGATIVE
PH UA: 8.5 — AB (ref 5.0–7.5)
Protein, UA: NEGATIVE
RBC, UA: NEGATIVE
Specific Gravity, UA: 1.015 (ref 1.005–1.030)
UUROB: 0.2 mg/dL (ref 0.2–1.0)

## 2018-10-03 DIAGNOSIS — M25552 Pain in left hip: Secondary | ICD-10-CM | POA: Insufficient documentation

## 2020-01-01 ENCOUNTER — Emergency Department: Payer: Medicare Other

## 2020-01-01 ENCOUNTER — Other Ambulatory Visit: Payer: Self-pay

## 2020-01-01 ENCOUNTER — Encounter: Payer: Self-pay | Admitting: Emergency Medicine

## 2020-01-01 ENCOUNTER — Other Ambulatory Visit
Admission: RE | Admit: 2020-01-01 | Discharge: 2020-01-01 | Disposition: A | Discharge status: Home / Self Care | Payer: Medicare Other | Source: Ambulatory Visit | Attending: Neurosurgery | Admitting: Neurosurgery

## 2020-01-01 ENCOUNTER — Observation Stay
Admission: EM | Admit: 2020-01-01 | Discharge: 2020-01-02 | Disposition: A | Discharge status: Home / Self Care | Payer: Medicare Other | Attending: Emergency Medicine | Admitting: Emergency Medicine

## 2020-01-01 DIAGNOSIS — R0789 Other chest pain: Secondary | ICD-10-CM | POA: Diagnosis not present

## 2020-01-01 DIAGNOSIS — I10 Essential (primary) hypertension: Secondary | ICD-10-CM | POA: Diagnosis not present

## 2020-01-01 DIAGNOSIS — R0781 Pleurodynia: Secondary | ICD-10-CM | POA: Insufficient documentation

## 2020-01-01 DIAGNOSIS — Z20822 Contact with and (suspected) exposure to covid-19: Secondary | ICD-10-CM | POA: Insufficient documentation

## 2020-01-01 DIAGNOSIS — I313 Pericardial effusion (noninflammatory): Secondary | ICD-10-CM

## 2020-01-01 DIAGNOSIS — Z7982 Long term (current) use of aspirin: Secondary | ICD-10-CM | POA: Diagnosis not present

## 2020-01-01 DIAGNOSIS — I3139 Other pericardial effusion (noninflammatory): Secondary | ICD-10-CM

## 2020-01-01 DIAGNOSIS — E119 Type 2 diabetes mellitus without complications: Secondary | ICD-10-CM | POA: Insufficient documentation

## 2020-01-01 DIAGNOSIS — Z79899 Other long term (current) drug therapy: Secondary | ICD-10-CM | POA: Diagnosis not present

## 2020-01-01 DIAGNOSIS — I2510 Atherosclerotic heart disease of native coronary artery without angina pectoris: Secondary | ICD-10-CM | POA: Insufficient documentation

## 2020-01-01 DIAGNOSIS — R000 Tachycardia, unspecified: Secondary | ICD-10-CM | POA: Insufficient documentation

## 2020-01-01 DIAGNOSIS — Z87891 Personal history of nicotine dependence: Secondary | ICD-10-CM | POA: Diagnosis not present

## 2020-01-01 LAB — CBC
HCT: 41.7 % (ref 39.0–52.0)
Hemoglobin: 14.5 g/dL (ref 13.0–17.0)
MCH: 30.5 pg (ref 26.0–34.0)
MCHC: 34.8 g/dL (ref 30.0–36.0)
MCV: 87.6 fL (ref 80.0–100.0)
Platelets: 141 10*3/uL — ABNORMAL LOW (ref 150–400)
RBC: 4.76 MIL/uL (ref 4.22–5.81)
RDW: 13.2 % (ref 11.5–15.5)
WBC: 10.6 10*3/uL — ABNORMAL HIGH (ref 4.0–10.5)
nRBC: 0 % (ref 0.0–0.2)

## 2020-01-01 LAB — BASIC METABOLIC PANEL
Anion gap: 12 (ref 5–15)
BUN: 21 mg/dL (ref 8–23)
CO2: 25 mmol/L (ref 22–32)
Calcium: 8.8 mg/dL — ABNORMAL LOW (ref 8.9–10.3)
Chloride: 101 mmol/L (ref 98–111)
Creatinine, Ser: 0.88 mg/dL (ref 0.61–1.24)
GFR calc Af Amer: >60 mL/min (ref >60)
GFR calc non Af Amer: >60 mL/min (ref >60)
Glucose, Bld: 174 mg/dL — ABNORMAL HIGH (ref 70–99)
Potassium: 3.8 mmol/L (ref 3.5–5.1)
Sodium: 138 mmol/L (ref 135–145)

## 2020-01-01 LAB — FIBRIN DERIVATIVES D-DIMER (ARMC ONLY): Fibrin derivatives D-dimer (ARMC): 603.18 ng/mL (FEU) — ABNORMAL HIGH (ref 0.00–499.00)

## 2020-01-01 LAB — TROPONIN I (HIGH SENSITIVITY)
Troponin I (High Sensitivity): 4 ng/L (ref <18)
Troponin I (High Sensitivity): 4 ng/L (ref <18)
Troponin I (High Sensitivity): 5 ng/L (ref <18)

## 2020-01-01 LAB — TSH: TSH: 1.769 u[IU]/mL (ref 0.350–4.500)

## 2020-01-01 LAB — SARS CORONAVIRUS 2 BY RT PCR (HOSPITAL ORDER, PERFORMED IN ~~LOC~~ HOSPITAL LAB): SARS Coronavirus 2: NEGATIVE

## 2020-01-01 MED ORDER — ATORVASTATIN CALCIUM 20 MG PO TABS
40.0000 mg | ORAL_TABLET | Freq: Every day | ORAL | Status: DC
  Administered 2020-01-01: 40 mg via ORAL
  Filled 2020-01-01: qty 2

## 2020-01-01 MED ORDER — SODIUM CHLORIDE 0.9% FLUSH
3.0000 mL | Freq: Two times a day (BID) | INTRAVENOUS | Status: DC
  Administered 2020-01-02: 3 mL via INTRAVENOUS

## 2020-01-01 MED ORDER — COLCHICINE 0.6 MG PO TABS
0.6000 mg | ORAL_TABLET | Freq: Two times a day (BID) | ORAL | Status: DC
  Administered 2020-01-01 – 2020-01-02 (×2): 0.6 mg via ORAL
  Filled 2020-01-01 (×5): qty 1

## 2020-01-01 MED ORDER — ASPIRIN 81 MG PO TABS: 81.0000 mg | ORAL_TABLET | Freq: Every day | ORAL | Status: DC

## 2020-01-01 MED ORDER — PANTOPRAZOLE SODIUM 40 MG PO TBEC
40.0000 mg | DELAYED_RELEASE_TABLET | Freq: Every day | ORAL | Status: DC
  Administered 2020-01-01 – 2020-01-02 (×2): 40 mg via ORAL
  Filled 2020-01-01 (×2): qty 1

## 2020-01-01 MED ORDER — ACETAMINOPHEN 500 MG PO TABS
1000.0000 mg | ORAL_TABLET | Freq: Four times a day (QID) | ORAL | Status: DC | PRN
  Administered 2020-01-01: 1000 mg via ORAL
  Filled 2020-01-01: qty 2

## 2020-01-01 MED ORDER — IBUPROFEN 400 MG PO TABS
600.0000 mg | ORAL_TABLET | Freq: Three times a day (TID) | ORAL | Status: DC
  Administered 2020-01-01 – 2020-01-02 (×2): 600 mg via ORAL
  Filled 2020-01-01: qty 2
  Filled 2020-01-01: qty 1

## 2020-01-01 MED ORDER — TRAZODONE HCL 50 MG PO TABS
50.0000 mg | ORAL_TABLET | Freq: Every day | ORAL | Status: DC
  Administered 2020-01-01: 50 mg via ORAL
  Filled 2020-01-01: qty 1

## 2020-01-01 MED ORDER — SODIUM CHLORIDE 0.9 % IV SOLN: 250.0000 mL | INTRAVENOUS | Status: DC | PRN

## 2020-01-01 MED ORDER — SODIUM CHLORIDE 0.9% FLUSH: 3.0000 mL | INTRAVENOUS | Status: DC | PRN

## 2020-01-01 MED ORDER — ENOXAPARIN SODIUM 40 MG/0.4ML ~~LOC~~ SOLN
40.0000 mg | SUBCUTANEOUS | Status: DC
  Administered 2020-01-01: 40 mg via SUBCUTANEOUS
  Filled 2020-01-01: qty 0.4

## 2020-01-01 MED ORDER — METOPROLOL SUCCINATE ER 50 MG PO TB24
25.0000 mg | ORAL_TABLET | Freq: Every day | ORAL | Status: DC
  Administered 2020-01-01 – 2020-01-02 (×2): 25 mg via ORAL
  Filled 2020-01-01 (×2): qty 1

## 2020-01-01 MED ORDER — ASPIRIN EC 81 MG PO TBEC
81.0000 mg | DELAYED_RELEASE_TABLET | Freq: Every day | ORAL | Status: DC
  Administered 2020-01-01 – 2020-01-02 (×2): 81 mg via ORAL
  Filled 2020-01-01 (×2): qty 1

## 2020-01-01 MED ORDER — IOHEXOL 350 MG/ML SOLN
75.0000 mL | Freq: Once | INTRAVENOUS | Status: AC | PRN
  Administered 2020-01-01: 75 mL via INTRAVENOUS

## 2020-01-01 NOTE — ED Notes (Signed)
Pt in CT.

## 2020-01-01 NOTE — ED Notes (Signed)
Pt c/o central CP that readiates into both arms. Pt states pain is worse on inspiration.

## 2020-01-01 NOTE — ED Provider Notes (Addendum)
[facility] Emergency Department Provider Note   ____________________________________________    I have reviewed the triage vital signs and the nursing notes.   HISTORY  Chief Complaint Chest Pain and Shortness of Breath     HPI Eric Hanna is a 74 y.o. male with history of CAD, borderline diabetes who presents with complaints of chest pain and mild shortness of breath.  Patient reports around 11:00 this morning he developed chest tightness with pleurisy relatively suddenly.  He reports he had a period of chest tightness about 2 to 3 weeks ago which resolved.  Discomfort is not exertional.  He is not take anything for this.  Does take aspirin.  No history of blood clots.  No recent travel.  No calf pain or swelling.  No nausea vomiting.  Afebrile.  No cough.  Did go to urgent care and was prescribed prednisone and a Z-Pak.  Past Medical History:  Diagnosis Date  . Coronary artery disease   . DDD (degenerative disc disease), cervical   . Depression   . Diabetes mellitus without complication ([place])    borderline; no meds; no blood sugar checks  . Diarrhea   . GERD (gastroesophageal reflux disease)   . Hypertension   . Hypertrophy of prostate   . Impotence, organic   . Lower urinary tract symptoms (LUTS)   . Lumbar trigger point syndrome   . Malignant neoplasm of bladder ([place])   . Myocardial infarction ([place]) 2000  . Obesity   . OSA (obstructive sleep apnea)    CPAP  . Osteoarthritis   . Sleep apnea   . Spondylolisthesis of cervical region   . Testicular hypofunction   . WPW (Wolff-Parkinson-White syndrome)     There are no problems to display for this patient.   Past Surgical History:  Procedure Laterality Date  . CARDIAC CATHETERIZATION    . CATARACT EXTRACTION W/PHACO Left 06/05/2017   Procedure: CATARACT EXTRACTION PHACO AND INTRAOCULAR LENS PLACEMENT ([place]) LEFT;  Surgeon: Leandrew Koyanagi, MD;  Location: [place];   Service: Ophthalmology;  Laterality: Left;  sleep apnea  . CATARACT EXTRACTION W/PHACO Right 07/03/2017   Procedure: CATARACT EXTRACTION PHACO AND INTRAOCULAR LENS PLACEMENT ([place]) RIGHT;  Surgeon: Leandrew Koyanagi, MD;  Location: [place];  Service: Ophthalmology;  Laterality: Right;  sleep apnea  . CHIN IMPLANT     . COLONOSCOPY    . COLONOSCOPY WITH PROPOFOL N/A 09/07/2015   Procedure: COLONOSCOPY WITH PROPOFOL;  Surgeon: Manya Silvas, MD;  Location: [facility] ENDOSCOPY;  Service: Endoscopy;  Laterality: N/A;  . HAND SURGERY    . STOMACH REDUCTION WITHOUT BYPASS    . X-STOP IMPLANTATION      Prior to Admission medications   Medication Sig Start Date End Date Taking? Authorizing Provider  alendronate (FOSAMAX) 70 MG tablet Take 70 mg by mouth once a week. Take with a full glass of water on an empty stomach.    [provider]  ALPRAZolam Duanne Moron) 1 MG tablet 1 tab 30 min prior to procedure 08/09/17   Stoioff, Ronda Fairly, MD  aspirin 81 MG tablet Take 81 mg by mouth daily.    [provider]  Calcium Carbonate-Vitamin D 600-400 MG-UNIT tablet Take 1 tablet by mouth daily.    [provider]  cetirizine (ZYRTEC) 10 MG tablet Take 10 mg by mouth daily.    [provider]  COENZYME Q10 PO Take 1 capsule by mouth daily.    [provider]  HYDROcodone-acetaminophen (NORCO/VICODIN) 5-325 MG tablet 1 tab 30 min prior to procedure 08/09/17   Stoioff, Ronda Fairly, MD  metoprolol succinate (TOPROL-XL) 50 MG 24 hr tablet Take 25 mg by mouth daily. Take with or immediately following a meal.     [provider]  Multiple Vitamin (MULTIVITAMIN) capsule Take 1 capsule by mouth daily.    [provider]  Omega-3 Fatty Acids (FISH OIL) 1200 MG CPDR Take by mouth daily.    [provider]  omeprazole (PRILOSEC) 20 MG capsule Take 20 mg by mouth daily.    [provider]  sildenafil (VIAGRA) 100 MG tablet Take 100 mg by mouth  daily as needed for erectile dysfunction.    [provider]  simvastatin (ZOCOR) 80 MG tablet Take 80 mg by mouth daily.    [provider]  Testosterone 75 MG PLLT 75 mg by Implant route. Every 4-6 months.    [provider]  traZODone (DESYREL) 50 MG tablet Take 50 mg by mouth at bedtime. 11/12/16   [provider]     Allergies Patient has no known allergies.  No family history on file.  Social History Social History   Tobacco Use  . Smoking status: Former Smoker    Quit date: 2000    Years since quitting: 21.6  . Smokeless tobacco: Never Used  Vaping Use  . Vaping Use: Never used  Substance Use Topics  . Alcohol use: No  . Drug use: No    Review of Systems  Constitutional: No fever/chills Eyes: No visual changes.  ENT: No sore throat. Cardiovascular: As above Respiratory: As above Gastrointestinal: No abdominal pain.  No nausea, no vomiting.   Genitourinary: Negative for dysuria. Musculoskeletal: Negative for back pain. Skin: Negative for rash. Neurological: Negative for headaches    ____________________________________________   PHYSICAL EXAM:  VITAL SIGNS: ED Triage Vitals  Enc Vitals Group     BP 01/01/20 1810 (!) 141/97     Pulse Rate 01/01/20 1810 (!) 117     Resp 01/01/20 1810 18     Temp 01/01/20 1810 98.8 F (37.1 C)     Temp Source 01/01/20 1810 Oral     SpO2 01/01/20 1810 95 %     Weight 01/01/20 1814 111.1 kg (245 lb)     Height 01/01/20 1814 1.854 m (6\' 1" )     Head Circumference --      Peak Flow --      Pain Score 01/01/20 1814 6     Pain Loc --      Pain Edu? --      Excl. in [place]? --     Constitutional: Alert and oriented. No acute distress. Pleasant and interactive  Nose: No congestion/rhinnorhea. Mouth/Throat: Mucous membranes are moist.   Neck:  Painless ROM Cardiovascular: Tachycardia, regular rhythm. Grossly normal heart sounds.  Good peripheral circulation. Respiratory: Normal respiratory  effort.  No retractions.  Gastrointestinal: Soft and nontender. No distention.  No CVA tenderness.  Musculoskeletal: No lower extremity tenderness nor edema.  Warm and well perfused Neurologic:  Normal speech and language. No gross focal neurologic deficits are appreciated.  Skin:  Skin is warm, dry and intact. No rash noted. Psychiatric: Mood and affect are normal. Speech and behavior are normal.  ____________________________________________   LABS (all labs ordered are listed, but only abnormal results are displayed)  Labs Reviewed  BASIC METABOLIC PANEL - Abnormal; Notable for the following components:      Result Value  Glucose, Bld 174 (*)    Calcium 8.8 (*)    All other components within normal limits  CBC - Abnormal; Notable for the following components:   WBC 10.6 (*)    Platelets 141 (*)    All other components within normal limits  TROPONIN I (HIGH SENSITIVITY)  TROPONIN I (HIGH SENSITIVITY)   ____________________________________________  EKG  ED ECG REPORT I, Lavonia Drafts, the attending physician, personally viewed and interpreted this ECG.  Date: 01/01/2020  Rhythm: Sinus tachycardia QRS Axis: normal Intervals: normal ST/T Wave abnormalities: normal Narrative Interpretation: no evidence of acute ischemia  ____________________________________________  RADIOLOGY  Chest x-ray reviewed by me, no infiltrate or effusion  CT angiography ____________________________________________   PROCEDURES  Procedure(s) performed: No  .1-3 Lead EKG Interpretation Performed by: Lavonia Drafts, MD Authorized by: Lavonia Drafts, MD     Interpretation: normal     ECG rate assessment: tachycardic     Rhythm: sinus tachycardia     Ectopy: none     Conduction: normal       Critical Care performed: No ____________________________________________   INITIAL IMPRESSION / ASSESSMENT AND PLAN / ED COURSE  Pertinent labs & imaging results that were available  during my care of the patient were reviewed by me and considered in my medical decision making (see chart for details).  Patient presents with chest pain with pleurisy as noted above.  Reportedly had labs drawn at urgent care, was able to review via care everywhere function, unremarkable CMP and CBC.  Had a high-sensitivity troponin drawn as well at urgent care which was normal and a mildly elevated D-dimer.  Differential includes PE, ACS, not consistent with dissection  Chest x-ray reviewed by me, no infiltrate effusion or pneumothorax  We will send for CT angiography  CT angiography demonstrates no pulmonary embolism, small to moderate circumferential pericardial effusion and fusiform aneurysmal dilatation, discussed all these results with patient and his wife.  He continues to have chest discomfort, second troponin is reassuring.  Pericardial effusion, isolated with pleuritic chest pain raises suspicion for pericarditis although EKG not consistent.  We will discuss with the hospitalist for admission given continued discomfort and tachycardia with mild tachypnea.    _____________________   FINAL CLINICAL IMPRESSION(S) / ED DIAGNOSES  Final diagnoses:  Atypical chest pain        Note:  This document was prepared using Dragon voice recognition software and may include unintentional dictation errors.   Lavonia Drafts, MD 01/01/20 Langston Reusing    Lavonia Drafts, MD 01/01/20 Karl Bales

## 2020-01-01 NOTE — ED Triage Notes (Signed)
Pt here with worsening chest pain since mid morning, states every breath he takes is painful, was sent here from [facility] with concern for blood clot. Was tested at [facility] for Covid today. On a baby ASA daily, taking shallow breaths so the pain isn't severe. Alert and oriented.

## 2020-01-01 NOTE — Consult Note (Signed)
Eric Hanna is a 74 y.o. male  [id number]  Primary Cardiologist: Dulce Sellar Reason for Consultation: Chest pain  HPI: This is a 74 year old white male with a history of coronary artery disease and questionable WPW syndrome presented to the hospital with chest pain with which started 11 AM and continued on until 6 PM.  At 2 PM he went to urgent care center at [facility] clinic and he had lab work done and was sent home with the advice that he would be called about the results of the labs.  Patient never heard of back from urgent care and got very anxious and called me and I recommended that he go to the emergency room since he was having chest pain.  He was seen in the emergency room with chest pain which appeared to be worse with deep inspiration and coughing 5/10 which is ongoing.   Review of Systems: No orthopnea PND or leg swelling   Past Medical History:  Diagnosis Date  . Coronary artery disease   . DDD (degenerative disc disease), cervical   . Depression   . Diabetes mellitus without complication ([place])    borderline; no meds; no blood sugar checks  . Diarrhea   . GERD (gastroesophageal reflux disease)   . Hypertension   . Hypertrophy of prostate   . Impotence, organic   . Lower urinary tract symptoms (LUTS)   . Lumbar trigger point syndrome   . Malignant neoplasm of bladder ([place])   . Myocardial infarction ([place]) 2000  . Obesity   . OSA (obstructive sleep apnea)    CPAP  . Osteoarthritis   . Sleep apnea   . Spondylolisthesis of cervical region   . Testicular hypofunction   . WPW (Wolff-Parkinson-White syndrome)     (Not in a hospital admission)    . aspirin EC  81 mg Oral Daily  . atorvastatin  40 mg Oral Daily  . colchicine  0.6 mg Oral BID  . enoxaparin (LOVENOX) injection  40 mg Subcutaneous Q24H  . ibuprofen  600 mg Oral TID  . lidocaine  1 application Urethral Once  . metoprolol succinate  25 mg Oral Daily  . pantoprazole  40 mg Oral Daily  . sodium  chloride flush  3 mL Intravenous Q12H  . traZODone  50 mg Oral QHS    Infusions: . sodium chloride      No Known Allergies  Social History   Socioeconomic History  . Marital status: Married    Spouse name: Not on file  . Number of children: Not on file  . Years of education: Not on file  . Highest education level: Not on file  Occupational History  . Not on file  Tobacco Use  . Smoking status: Former Smoker    Quit date: 2000    Years since quitting: 21.6  . Smokeless tobacco: Never Used  Vaping Use  . Vaping Use: Never used  Substance and Sexual Activity  . Alcohol use: No  . Drug use: No  . Sexual activity: Not on file  Other Topics Concern  . Not on file  Social History Narrative  . Not on file   Social Determinants of Health   Financial Resource Strain:   . Difficulty of Paying Living Expenses: Not on file  Food Insecurity:   . Worried About Charity fundraiser in the Last Year: Not on file  . Ran Out of Food in the Last Year: Not on file  Transportation  Needs:   . Lack of Transportation (Medical): Not on file  . Lack of Transportation (Non-Medical): Not on file  Physical Activity:   . Days of Exercise per Week: Not on file  . Minutes of Exercise per Session: Not on file  Stress:   . Feeling of Stress : Not on file  Social Connections:   . Frequency of Communication with Friends and Family: Not on file  . Frequency of Social Gatherings with Friends and Family: Not on file  . Attends Religious Services: Not on file  . Active Member of Clubs or Organizations: Not on file  . Attends Archivist Meetings: Not on file  . Marital Status: Not on file  Intimate Partner Violence:   . Fear of Current or Ex-Partner: Not on file  . Emotionally Abused: Not on file  . Physically Abused: Not on file  . Sexually Abused: Not on file    No family history on file.  PHYSICAL EXAM: Vitals:   01/01/20 2100 01/01/20 2115  BP:    Pulse:  87  Resp: 15 18   Temp:    SpO2:  96%    No intake or output data in the 24 hours ending 01/01/20 2149  General:  Well appearing. No respiratory difficulty HEENT: normal Neck: supple. no JVD. Carotids 2+ bilat; no bruits. No lymphadenopathy or thryomegaly appreciated. Cor: PMI nondisplaced. Regular rate & rhythm. No rubs, gallops or murmurs. Lungs: clear Abdomen: soft, nontender, nondistended. No hepatosplenomegaly. No bruits or masses. Good bowel sounds. Extremities: no cyanosis, clubbing, rash, edema Neuro: alert & oriented x 3, cranial nerves grossly intact. moves all 4 extremities w/o difficulty. Affect pleasant.  ECG: Sinus tachycardia with nonspecific ST-T changes  Results for orders placed or performed during the hospital encounter of 01/01/20 (from the past 24 hour(s))  Basic metabolic panel     Status: Abnormal   Collection Time: 01/01/20  6:25 PM  Result Value Ref Range   Sodium 138 135 - 145 mmol/L   Potassium 3.8 3.5 - 5.1 mmol/L   Chloride 101 98 - 111 mmol/L   CO2 25 22 - 32 mmol/L   Glucose, Bld 174 (H) 70 - 99 mg/dL   BUN 21 8 - 23 mg/dL   Creatinine, Ser 0.88 0.61 - 1.24 mg/dL   Calcium 8.8 (L) 8.9 - 10.3 mg/dL   GFR calc non Af Amer >60 >60 mL/min   GFR calc Af Amer >60 >60 mL/min   Anion gap 12 5 - 15  CBC     Status: Abnormal   Collection Time: 01/01/20  6:25 PM  Result Value Ref Range   WBC 10.6 (H) 4.0 - 10.5 K/uL   RBC 4.76 4.22 - 5.81 MIL/uL   Hemoglobin 14.5 13.0 - 17.0 g/dL   HCT 41.7 39 - 52 %   MCV 87.6 80.0 - 100.0 fL   MCH 30.5 26.0 - 34.0 pg   MCHC 34.8 30.0 - 36.0 g/dL   RDW 13.2 11.5 - 15.5 %   Platelets 141 (L) 150 - 400 K/uL   nRBC 0.0 0.0 - 0.2 %  Troponin I (High Sensitivity)     Status: None   Collection Time: 01/01/20  6:25 PM  Result Value Ref Range   Troponin I (High Sensitivity) 4 <18 ng/L  SARS Coronavirus 2 by RT PCR (hospital order, performed in [place] hospital lab) Nasopharyngeal Nasopharyngeal Swab     Status: None   Collection  Time: 01/01/20  8:24 PM  Specimen: Nasopharyngeal Swab  Result Value Ref Range   SARS Coronavirus 2 NEGATIVE NEGATIVE  Troponin I (High Sensitivity)     Status: None   Collection Time: 01/01/20  8:25 PM  Result Value Ref Range   Troponin I (High Sensitivity) 5 <18 ng/L  TSH     Status: None   Collection Time: 01/01/20  8:25 PM  Result Value Ref Range   TSH 1.769 0.350 - 4.500 uIU/mL   DG Chest 2 View  Result Date: 01/01/2020 CLINICAL DATA:  Pleuritic chest pain. EXAM: CHEST - 2 VIEW COMPARISON:  Chest x-ray report from same day. Chest x-ray dated July 09, 2009. FINDINGS: Unchanged mild cardiomegaly. Normal pulmonary vascularity. No focal consolidation, pleural effusion, or pneumothorax. No acute osseous abnormality. IMPRESSION: No active cardiopulmonary disease. Electronically Signed   By: Titus Dubin M.D.   On: 01/01/2020 19:00   CT Angio Chest PE W and/or Wo Contrast  Result Date: 01/01/2020 CLINICAL DATA:  Progressive chest pain since this morning. PE suspected, high prob EXAM: CT ANGIOGRAPHY CHEST WITH CONTRAST TECHNIQUE: Multidetector CT imaging of the chest was performed using the standard protocol during bolus administration of intravenous contrast. Multiplanar CT image reconstructions and MIPs were obtained to evaluate the vascular anatomy. CONTRAST:  22mL OMNIPAQUE IOHEXOL 350 MG/ML SOLN COMPARISON:  Radiograph earlier today. FINDINGS: Cardiovascular: There are no filling defects within the pulmonary arteries to suggest pulmonary embolus. Aortic atherosclerosis without dissection. 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 Borderline heart size. There are coronary artery calcifications. Small to moderate circumferential pericardial effusion that measures between 9 and 13 mm. Mediastinum/Nodes: Scattered small mediastinal lymph nodes that are not enlarged by size criteria. No thyroid nodule. No esophageal wall thickening. Lungs/Pleura: Mild hypoventilatory changes  in the dependent lungs with scattered subsegmental atelectasis in the lower lobes. No confluent airspace disease. The trachea and central bronchi are patent. No findings of pulmonary edema. No pleural fluid. Upper Abdomen: Postsurgical change in the stomach. No acute findings. Musculoskeletal: Mild exaggerated thoracic kyphosis. Mild compression fracture of T10 with loss of height primarily involving the central vertebral body. Review of the MIP images confirms the above findings. IMPRESSION: 1. No pulmonary embolus. 2. Small to moderate circumferential pericardial effusion. 3. 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 No dissection. Recommend annual imaging followup by CTA or MRA. This recommendation follows 2010 ACCF/AHA/AATS/ACR/ASA/SCA/SCAI/SIR/STS/SVM Guidelines for the Diagnosis and Management of Patients with Thoracic Aortic Disease. Circulation. 2010; 121: T977-S142. Aortic aneurysm NOS (ICD10-I71.9) 4. Coronary artery calcifications. Aortic Atherosclerosis (ICD10-I70.0). Electronically Signed   By: Keith Rake M.D.   On: 01/01/2020 19:30     ASSESSMENT AND PLAN: Pleuritic chest pain will CTA chest revealing small to moderate pericardial effusion and mildly dilated aortic root about 4.3 centimeters.  Chest pain probably due to pericarditis since troponins are also negative.  Agree with starting the patient on ibuprofen and colchicine.  May stop Lovenox since pericardial effusion can turn into hemorrhagic effusion.  Also get echocardiogram to further evaluate effusion.  If ibuprofen does not relieve the symptoms may need Indocin.  He normally sees can order clinic cardiology but family requested that I see him while he is here.  Eric Hanna A

## 2020-01-01 NOTE — H&P (Signed)
History and Physical    GARYN ARLOTTA ZDG:[id number] DOB: 1946/04/21 DOA: 01/01/2020  PCP: Kirk Ruths, MD  Patient coming from: home   Chief Complaint: pleuritic chest pain  HPI: Eric Hanna is a 74 y.o. male with medical history significant for CAD/MI in early 2000s, OSA on cpap, bladder cancer s/p treatment w/ no signs recurrence, htn, dm, who presents with the above.  New problem, first developed 3 wks ago and lasted for a few days. Upper substernal chest pain with breathing, worse with bigger breaths. No cough or shortness of breath. Not exertional. No fevers. No recent illness or medication changes. Not positional. Resolved spontaneously after 3 days, and started again this morning. Went to an urgent care where labs were drawn and patient prescribed prednisone. Called his cardiologist who advised presenting to the ED. No recent trauma.  ED Course: labs, imaging  Review of Systems: As per HPI otherwise 10 point review of systems negative.    Past Medical History:  Diagnosis Date  . Coronary artery disease   . DDD (degenerative disc disease), cervical   . Depression   . Diabetes mellitus without complication ([place])    borderline; no meds; no blood sugar checks  . Diarrhea   . GERD (gastroesophageal reflux disease)   . Hypertension   . Hypertrophy of prostate   . Impotence, organic   . Lower urinary tract symptoms (LUTS)   . Lumbar trigger point syndrome   . Malignant neoplasm of bladder ([place])   . Myocardial infarction ([place]) 2000  . Obesity   . OSA (obstructive sleep apnea)    CPAP  . Osteoarthritis   . Sleep apnea   . Spondylolisthesis of cervical region   . Testicular hypofunction   . WPW (Wolff-Parkinson-White syndrome)     Past Surgical History:  Procedure Laterality Date  . CARDIAC CATHETERIZATION    . CATARACT EXTRACTION W/PHACO Left 06/05/2017   Procedure: CATARACT EXTRACTION PHACO AND INTRAOCULAR LENS PLACEMENT ([place]) LEFT;  Surgeon: Leandrew Koyanagi, MD;  Location: [facility];  Service: Ophthalmology;  Laterality: Left;  sleep apnea  . CATARACT EXTRACTION W/PHACO Right 07/03/2017   Procedure: CATARACT EXTRACTION PHACO AND INTRAOCULAR LENS PLACEMENT ([place]) RIGHT;  Surgeon: Leandrew Koyanagi, MD;  Location: [facility];  Service: Ophthalmology;  Laterality: Right;  sleep apnea  . CHIN IMPLANT     . COLONOSCOPY    . COLONOSCOPY WITH PROPOFOL N/A 09/07/2015   Procedure: COLONOSCOPY WITH PROPOFOL;  Surgeon: Manya Silvas, MD;  Location: [facility] END[facility]COPY;  Service: Endoscopy;  Laterality: N/A;  . HAND SURGERY    . STOMACH REDUCTION WITHOUT BYPASS    . X-STOP IMPLANTATION       reports that he quit smoking about 21 years ago. He has never used smokeless tobacco. He reports that he does not drink alcohol and does not use drugs.  No Known Allergies  No family history on file.  Prior to Admission medications   Medication Sig Start Date End Date Taking? Authorizing Provider  alendronate (FOSAMAX) 70 MG tablet Take 70 mg by mouth once a week. Take with a full glass of water on an empty stomach.    [provider]  ALPRAZolam Duanne Moron) 1 MG tablet 1 tab 30 min prior to procedure 08/09/17   Stoioff, Ronda Fairly, MD  aspirin 81 MG tablet Take 81 mg by mouth daily.    [provider]  Calcium Carbonate-Vitamin D 600-400 MG-UNIT tablet Take 1 tablet by mouth daily.  [provider]  cetirizine (ZYRTEC) 10 MG tablet Take 10 mg by mouth daily.    [provider]  COENZYME Q10 PO Take 1 capsule by mouth daily.    [provider]  HYDROcodone-acetaminophen (NORCO/VICODIN) 5-325 MG tablet 1 tab 30 min prior to procedure 08/09/17   Stoioff, Ronda Fairly, MD  metoprolol succinate (TOPROL-XL) 50 MG 24 hr tablet Take 25 mg by mouth daily. Take with or immediately following a meal.     [provider]  Multiple Vitamin (MULTIVITAMIN) capsule Take 1 capsule by mouth daily.    [provider]  Omega-3 Fatty Acids (FISH OIL) 1200 MG CPDR Take by mouth daily.    [provider]  omeprazole (PRILOSEC) 20 MG capsule Take 20 mg by mouth daily.    [provider]  sildenafil (VIAGRA) 100 MG tablet Take 100 mg by mouth daily as needed for erectile dysfunction.    [provider]  simvastatin (ZOCOR) 80 MG tablet Take 80 mg by mouth daily.    [provider]  traZODone (DESYREL) 50 MG tablet Take 50 mg by mouth at bedtime. 11/12/16   [provider]    Physical Exam: Vitals:   01/01/20 1810 01/01/20 1814 01/01/20 1850  BP: (!) 141/97    Pulse: (!) 117  (!) 107  Resp: 18  (!) 23  Temp: 98.8 F (37.1 C)    TempSrc: Oral    SpO2: 95%  96%  Weight:  111.1 kg   Height:  6\' 1"  (1.854 m)     Constitutional: No acute distress Head: Atraumatic Eyes: Conjunctiva clear ENM: Moist mucous membranes. Normal dentition.  Neck: Supple Respiratory: Clear to auscultation bilaterally, no wheezing/rales/rhonchi. Normal respiratory effort. No accessory muscle use. . Cardiovascular: Regular rate and rhythm. Soft systolic murmur, no rub Abdomen: Non-tender, non-distended. No masses. No rebound or guarding. Positive bowel sounds. Musculoskeletal: No joint deformity upper and lower extremities. Normal ROM, no contractures. Normal muscle tone.  Skin: No rashes, lesions, or ulcers.  Extremities: No peripheral edema. Palpable peripheral pulses. Neurologic: Alert, moving all 4 extremities. Psychiatric: Normal insight and judgement.   Labs on Admission: I have personally reviewed following labs and imaging studies  CBC: Recent Labs  Lab 01/01/20 1825  WBC 10.6*  HGB 14.5  HCT 41.7  MCV 87.6  PLT 341*   Basic Metabolic Panel: Recent Labs  Lab 01/01/20 1825  NA 138  K 3.8  CL 101  CO2 25  GLUCOSE 174*  BUN 21  CREATININE 0.88  CALCIUM 8.8*   GFR: Estimated Creatinine Clearance: 94.8 mL/min (by C-G formula based on SCr of 0.88  mg/dL). Liver Function Tests: No results for input(s): AST, ALT, ALKPHOS, BILITOT, PROT, ALBUMIN in the last 168 hours. No results for input(s): LIPASE, AMYLASE in the last 168 hours. No results for input(s): AMMONIA in the last 168 hours. Coagulation Profile: No results for input(s): INR, PROTIME in the last 168 hours. Cardiac Enzymes: No results for input(s): CKTOTAL, CKMB, CKMBINDEX, TROPONINI in the last 168 hours. BNP (last 3 results) No results for input(s): PROBNP in the last 8760 hours. HbA1C: No results for input(s): HGBA1C in the last 72 hours. CBG: No results for input(s): GLUCAP in the last 168 hours. Lipid Profile: No results for input(s): CHOL, HDL, LDLCALC, TRIG, CHOLHDL, LDLDIRECT in the last 72 hours. Thyroid Function Tests: No results for input(s): TSH, T4TOTAL, FREET4, T3FREE, THYROIDAB in the last 72 hours. Anemia Panel: No results for input(s): VITAMINB12, FOLATE,  FERRITIN, TIBC, IRON, RETICCTPCT in the last 72 hours. Urine analysis:    Component Value Date/Time   COLORURINE YELLOW (A) 12/27/2016 1116   APPEARANCEUR Clear 03/18/2018 1039   LABSPEC 1.019 12/27/2016 1116   PHURINE 6.0 12/27/2016 1116   GLUCOSEU Negative 03/18/2018 1039   HGBUR NEGATIVE 12/27/2016 1116   BILIRUBINUR Negative 03/18/2018 [place] 12/27/2016 1116   PROTEINUR Negative 03/18/2018 [place] 12/27/2016 1116   NITRITE Negative 03/18/2018 1039   NITRITE NEGATIVE 12/27/2016 1116   LEUKOCYTESUR Negative 03/18/2018 1039    Radiological Exams on Admission: DG Chest 2 View  Result Date: 01/01/2020 CLINICAL DATA:  Pleuritic chest pain. EXAM: CHEST - 2 VIEW COMPARISON:  Chest x-ray report from same day. Chest x-ray dated July 09, 2009. FINDINGS: Unchanged mild cardiomegaly. Normal pulmonary vascularity. No focal consolidation, pleural effusion, or pneumothorax. No acute osseous abnormality. IMPRESSION: No active cardiopulmonary disease. Electronically  Signed   By: Titus Dubin M.D.   On: 01/01/2020 19:00   CT Angio Chest PE W and/or Wo Contrast  Result Date: 01/01/2020 CLINICAL DATA:  Progressive chest pain since this morning. PE suspected, high prob EXAM: CT ANGIOGRAPHY CHEST WITH CONTRAST TECHNIQUE: Multidetector CT imaging of the chest was performed using the standard protocol during bolus administration of intravenous contrast. Multiplanar CT image reconstructions and MIPs were obtained to evaluate the vascular anatomy. CONTRAST:  59mL OMNIPAQUE IOHEXOL 350 MG/ML SOLN COMPARISON:  Radiograph earlier today. FINDINGS: Cardiovascular: There are no filling defects within the pulmonary arteries to suggest pulmonary embolus. Aortic atherosclerosis without dissection. 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 Borderline heart size. There are coronary artery calcifications. Small to moderate circumferential pericardial effusion that measures between 9 and 13 mm. Mediastinum/Nodes: Scattered small mediastinal lymph nodes that are not enlarged by size criteria. No thyroid nodule. No esophageal wall thickening. Lungs/Pleura: Mild hypoventilatory changes in the dependent lungs with scattered subsegmental atelectasis in the lower lobes. No confluent airspace disease. The trachea and central bronchi are patent. No findings of pulmonary edema. No pleural fluid. Upper Abdomen: Postsurgical change in the stomach. No acute findings. Musculoskeletal: Mild exaggerated thoracic kyphosis. Mild compression fracture of T10 with loss of height primarily involving the central vertebral body. Review of the MIP images confirms the above findings. IMPRESSION: 1. No pulmonary embolus. 2. Small to moderate circumferential pericardial effusion. 3. 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 No dissection. Recommend annual imaging followup by CTA or MRA. This recommendation follows 2010 ACCF/AHA/AATS/ACR/ASA/SCA/SCAI/SIR/STS/SVM Guidelines for the  Diagnosis and Management of Patients with Thoracic Aortic Disease. Circulation. 2010; 121: Z366-Y403. Aortic aneurysm NOS (ICD10-I71.9) 4. Coronary artery calcifications. Aortic Atherosclerosis (ICD10-I70.0). Electronically Signed   By: Keith Rake M.D.   On: 01/01/2020 19:30    EKG: Independently reviewed. Sinus tachycardia  Assessment/Plan Active Problems:   Pericardial effusion   # Pericardial effusion  # Pleuritic chest pain- small moderate on CTA. No signs PE or dissection. Troponin neg x2. Effusion likely associated w/ his pleuritic pain. No rub or positional findings or characteristic ECG findings to strongly suggest acute pericarditis. Medical history w/o obvious cause of effusion. Hemodynamically stable - given possible pericarditis will treat overnight w/ nsaid and colchicine - likely cardiology referral in AM - telemetry  # Thrombocytopenia - mild, present on labs several years ago - f/u hiv, hcv  # OSA  - cont home cpap  # dm - diet controlled, here random glucose 174 - daily fasting sugars  #  Ascending aortic aneurysm - 4.3 cm on CTA, incidental finding - outpt f/u  # HTN - here bp wnl - cont home metoprolol, aspirin  # Insomnia - cont home trazodone  DVT prophylaxis: lovenox Code Status: full code  Family Communication: wife, updated @ bedside  Disposition Plan: tbd  Consults called: none  Admission status: obs tele    Desma Maxim MD [facility] Pager [phone]  If 7PM-7AM, please contact night-coverage [email or web address] Password [email or web address]  01/01/2020, 9:00 PM

## 2020-01-02 DIAGNOSIS — I313 Pericardial effusion (noninflammatory): Secondary | ICD-10-CM | POA: Diagnosis not present

## 2020-01-02 DIAGNOSIS — R0789 Other chest pain: Secondary | ICD-10-CM | POA: Diagnosis not present

## 2020-01-02 LAB — COMPREHENSIVE METABOLIC PANEL
ALT: 20 U/L (ref 0–44)
AST: 19 U/L (ref 15–41)
Albumin: 3.5 g/dL (ref 3.5–5.0)
Alkaline Phosphatase: 56 U/L (ref 38–126)
Anion gap: 6 (ref 5–15)
BUN: 19 mg/dL (ref 8–23)
CO2: 28 mmol/L (ref 22–32)
Calcium: 8.7 mg/dL — ABNORMAL LOW (ref 8.9–10.3)
Chloride: 104 mmol/L (ref 98–111)
Creatinine, Ser: 0.83 mg/dL (ref 0.61–1.24)
GFR calc Af Amer: >60 mL/min (ref >60)
GFR calc non Af Amer: >60 mL/min (ref >60)
Glucose, Bld: 161 mg/dL — ABNORMAL HIGH (ref 70–99)
Potassium: 3.7 mmol/L (ref 3.5–5.1)
Sodium: 138 mmol/L (ref 135–145)
Total Bilirubin: 0.9 mg/dL (ref 0.3–1.2)
Total Protein: 6.6 g/dL (ref 6.5–8.1)

## 2020-01-02 LAB — HIV ANTIBODY (ROUTINE TESTING W REFLEX): HIV Screen 4th Generation wRfx: NONREACTIVE

## 2020-01-02 LAB — CBC
HCT: 37.2 % — ABNORMAL LOW (ref 39.0–52.0)
Hemoglobin: 12.7 g/dL — ABNORMAL LOW (ref 13.0–17.0)
MCH: 31 pg (ref 26.0–34.0)
MCHC: 34.1 g/dL (ref 30.0–36.0)
MCV: 90.7 fL (ref 80.0–100.0)
Platelets: 132 10*3/uL — ABNORMAL LOW (ref 150–400)
RBC: 4.1 MIL/uL — ABNORMAL LOW (ref 4.22–5.81)
RDW: 13.4 % (ref 11.5–15.5)
WBC: 9.4 10*3/uL (ref 4.0–10.5)
nRBC: 0 % (ref 0.0–0.2)

## 2020-01-02 LAB — HEPATITIS C ANTIBODY: HCV Ab: NONREACTIVE

## 2020-01-02 MED ORDER — IBUPROFEN 600 MG PO TABS
600.0000 mg | ORAL_TABLET | Freq: Three times a day (TID) | ORAL | 0 refills | Status: DC
Start: 2020-01-02 — End: 2023-06-25

## 2020-01-02 MED ORDER — COLCHICINE 0.6 MG PO TABS
0.6000 mg | ORAL_TABLET | Freq: Two times a day (BID) | ORAL | 0 refills | Status: DC
Start: 2020-01-02 — End: 2023-06-25

## 2020-01-02 NOTE — Progress Notes (Signed)
IV removed, cardiac monitor removed. AVS given to patient, instructed on new medications and how to get those. Also educated on follow up appointments as well as when to return back to the ED. No questions comments or concerns. Will continue to monitor until wife arrives to take patient home.

## 2020-01-02 NOTE — Progress Notes (Signed)
SUBJECTIVE: patient feeling better   Vitals:   01/01/20 2254 01/02/20 0100 01/02/20 0200 01/02/20 0300  BP: 132/85 122/77 115/76 122/78  Pulse: 91 80 79 77  Resp:  (!) 21 20 19   Temp:  98.7 F (37.1 C)    TempSrc:  Oral    SpO2:  94% 95% 94%  Weight:      Height:       No intake or output data in the 24 hours ending 01/02/20 1006  LABS: Basic Metabolic Panel: Recent Labs    01/01/20 1825 01/02/20 0541  NA 138 138  K 3.8 3.7  CL 101 104  CO2 25 28  GLUCOSE 174* 161*  BUN 21 19  CREATININE 0.88 0.83  CALCIUM 8.8* 8.7*   Liver Function Tests: Recent Labs    01/02/20 0541  AST 19  ALT 20  ALKPHOS 56  BILITOT 0.9  PROT 6.6  ALBUMIN 3.5   No results for input(s): LIPASE, AMYLASE in the last 72 hours. CBC: Recent Labs    01/01/20 1825 01/02/20 0541  WBC 10.6* 9.4  HGB 14.5 12.7*  HCT 41.7 37.2*  MCV 87.6 90.7  PLT 141* 132*   Cardiac Enzymes: No results for input(s): CKTOTAL, CKMB, CKMBINDEX, TROPONINI in the last 72 hours. BNP: Invalid input(s): POCBNP D-Dimer: No results for input(s): DDIMER in the last 72 hours. Hemoglobin A1C: No results for input(s): HGBA1C in the last 72 hours. Fasting Lipid Panel: No results for input(s): CHOL, HDL, LDLCALC, TRIG, CHOLHDL, LDLDIRECT in the last 72 hours. Thyroid Function Tests: Recent Labs    01/01/20 2025  TSH 1.769   Anemia Panel: No results for input(s): VITAMINB12, FOLATE, FERRITIN, TIBC, IRON, RETICCTPCT in the last 72 hours.   PHYSICAL EXAM General: Well developed, well nourished, in no acute distress HEENT:  Normocephalic and atramatic Neck:  No JVD.  Lungs: Clear bilaterally to auscultation and percussion. Heart: HRRR . Normal S1 and S2 without gallops or murmurs.  Abdomen: Bowel sounds are positive, abdomen soft and non-tender  Msk:  Back normal, normal gait. Normal strength and tone for age. Extremities: No clubbing, cyanosis or edema.   Neuro: Alert and oriented X 3. Psych:  Good affect,  responds appropriately  TELEMETRY: NSR  ASSESSMENT AND PLAN: Pericarditis with small pericardial effusion, advise discharging with f/u Monday 9 am. Echo can be done at that time in office or here, but should be sent on either indocin or motrin and colchicine.  Active Problems:   Pericardial effusion    Eric Hanna A, MD, [facility] 01/02/2020 10:06 AM

## 2020-01-02 NOTE — Progress Notes (Signed)
Patient ready for discharge, just waiting for orders. Patients family is aware. Vss, no complaints at this time. Will continue to monitor.

## 2020-01-02 NOTE — Discharge Summary (Signed)
Physician Discharge Summary  Eric Hanna UVO:[id number] DOB: 07-21-1945 DOA: 01/01/2020  PCP: Kirk Ruths, MD  Admit date: 01/01/2020.  Discharge date: 01/02/2020.  Admitted From:  Home.  Disposition:  Home.  Recommendations for Outpatient Follow-up:  1. Follow up with PCP in 1-2 weeks. 2. Please obtain BMP/CBC in one week. 3. Advised to follow-up with Cardiology Dr. Neoma Laming on Monday for echocardiogram. 4. Advised to take ibuprofen and colchicine as a prescribed for pericarditis.   Home Health: No. Equipment/Devices: None.  Discharge Condition: Stable CODE STATUS:Full code Diet recommendation: Heart Healthy   Brief Summary : Eric Hanna is a 74 y.o. 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 It first developed 3 wks ago and lasted for a few days. Upper substernal chest pain with breathing, worse with bigger breaths. No cough or shortness of breath. Not exertional. No fevers. No recent illness or medication changes. Not positional. Resolved spontaneously after 3 days, and started again this morning. Went to an urgent care where labs were drawn and patient prescribed prednisone. Called his cardiologist who advised presenting to the ED. No recent trauma.  Hospital course: This patient was admitted with atypical chest pain.  Patient reports chest pain exertional and gets worse with position and deep breath.  CT chest negative for pulmonary embolism but it shows small to moderate pericardial effusion.  Troponins were negative.  Cardiology was consulted,  Patient was started on ibuprofen and colchicine for possible pericarditis.  Echocardiogram was ordered but chest pain has improved significantly with ibuprofen and colchicine.  Patient was seen by cardiology next day recommended Patient can be discharged home and patient will  follow up on Monday with cardiologist to have echocardiogram in the office.  Patient agreed and feels better and wants to be discharged.  Patient is being discharged home.  Advised to discontinue prednisone and Zithromax which was prescribed recently in the urgent care.  He was managed for below problems.  Discharge Diagnoses:  Active Problems:   Pericardial effusion  # Pericardial effusion  # Pleuritic chest pain- small to moderate on CTA. No signs PE or dissection. Troponin neg x2. Effusion likely associated w/ his pleuritic pain. No rub or positional findings or characteristic ECG findings to strongly suggest acute pericarditis. Medical history w/o obvious cause of effusion. Hemodynamically stable. - given possible pericarditis will treat overnight w/ nsaid and colchicine - Cardio consulted. - telemetry  # Thrombocytopenia - mild, present on labs several years ago - f/u hiv, hcv  # OSA  - cont home cpap  # dm - diet controlled, here random glucose 174 - daily fasting sugars  # Ascending aortic aneurysm - 4.3 cm on CTA, incidental finding - outpt f/u  # HTN - here bp wnl - cont home metoprolol, aspirin  # Insomnia - cont home trazodone  Discharge Instructions  Discharge Instructions    Call MD for:  difficulty breathing, headache or visual disturbances   Complete by: As directed    Call MD for:  persistant dizziness or light-headedness   Complete by: As directed    Call MD for:  persistant nausea and vomiting   Complete by: As directed    Call MD for:  temperature >100.4   Complete by: As directed    Diet - low sodium heart healthy   Complete by: As directed  Diet Carb Modified   Complete by: As directed    Discharge instructions   Complete by: As directed    Advised to follow-up with cardiology Dr. Earlyne Iba con on Monday for echocardiogram. Advised to take ibuprofen and colchicine as a prescribed for pericarditis. Advised to follow-up with primary care  physician in 1 week.   Increase activity slowly   Complete by: As directed      Allergies as of 01/02/2020   No Known Allergies     Medication List    STOP taking these medications   azithromycin 250 MG tablet Commonly known as: ZITHROMAX     TAKE these medications   alendronate 70 MG tablet Commonly known as: FOSAMAX Take 70 mg by mouth once a week. Take with a full glass of water on an empty stomach.   aspirin 81 MG tablet Take 81 mg by mouth daily.   calcium citrate-vitamin D 315-200 MG-UNIT tablet Commonly known as: CITRACAL+D Take 1 tablet by mouth daily.   cetirizine 10 MG tablet Commonly known as: ZYRTEC Take 10 mg by mouth daily.   COENZYME Q10 PO Take 1 capsule by mouth daily.   colchicine 0.6 MG tablet Take 1 tablet (0.6 mg total) by mouth 2 (two) times daily.   Fish Oil 1200 MG Cpdr Take by mouth daily.   ibuprofen 600 MG tablet Commonly known as: ADVIL Take 1 tablet (600 mg total) by mouth 3 (three) times daily.   metoprolol succinate 25 MG 24 hr tablet Commonly known as: TOPROL-XL Take 25 mg by mouth daily.   multivitamin capsule Take 1 capsule by mouth daily.   omeprazole 20 MG capsule Commonly known as: PRILOSEC Take 20 mg by mouth daily.   predniSONE 20 MG tablet Commonly known as: DELTASONE Take 20 mg by mouth 2 (two) times daily.   sildenafil 100 MG tablet Commonly known as: VIAGRA Take 100 mg by mouth daily as needed for erectile dysfunction.   simvastatin 80 MG tablet Commonly known as: ZOCOR Take 80 mg by mouth daily.   traZODone 50 MG tablet Commonly known as: DESYREL Take 50 mg by mouth at bedtime.       Follow-up Information    Kirk Ruths, MD Follow up in 1 week(s).   Specialty: Internal Medicine Contact information: [place] [address] [phone]        Dionisio David, MD Follow up.   Specialty: Cardiology Contact information: [place] [address] [phone]              No Known Allergies  Consultations:  Cardiology    Procedures/Studies: DG Chest 2 View  Result Date: 01/01/2020 CLINICAL DATA:  Pleuritic chest pain. EXAM: CHEST - 2 VIEW COMPARISON:  Chest x-ray report from same day. Chest x-ray dated July 09, 2009. FINDINGS: Unchanged mild cardiomegaly. Normal pulmonary vascularity. No focal consolidation, pleural effusion, or pneumothorax. No acute osseous abnormality. IMPRESSION: No active cardiopulmonary disease. Electronically Signed   By: Titus Dubin M.D.   On: 01/01/2020 19:00   CT Angio Chest PE W and/or Wo Contrast  Result Date: 01/01/2020 CLINICAL DATA:  Progressive chest pain since this morning. PE suspected, high prob EXAM: CT ANGIOGRAPHY CHEST WITH CONTRAST TECHNIQUE: Multidetector CT imaging of the chest was performed using the standard protocol during bolus administration of intravenous contrast. Multiplanar CT image reconstructions and MIPs were obtained to evaluate the vascular anatomy. CONTRAST:  34mL OMNIPAQUE IOHEXOL 350 MG/ML  SOLN COMPARISON:  Radiograph earlier today. FINDINGS: Cardiovascular: There are no filling defects within the pulmonary arteries to suggest pulmonary embolus. Aortic atherosclerosis without dissection. 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 Borderline heart size. There are coronary artery calcifications. Small to moderate circumferential pericardial effusion that measures between 9 and 13 mm. Mediastinum/Nodes: Scattered small mediastinal lymph nodes that are not enlarged by size criteria. No thyroid nodule. No esophageal wall thickening. Lungs/Pleura: Mild hypoventilatory changes in the dependent lungs with scattered subsegmental atelectasis in the lower lobes. No confluent airspace disease. The trachea and central bronchi are patent. No findings of pulmonary edema. No pleural fluid. Upper Abdomen: Postsurgical change in the stomach. No  acute findings. Musculoskeletal: Mild exaggerated thoracic kyphosis. Mild compression fracture of T10 with loss of height primarily involving the central vertebral body. Review of the MIP images confirms the above findings. IMPRESSION: 1. No pulmonary embolus. 2. Small to moderate circumferential pericardial effusion. 3. 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 No dissection. Recommend annual imaging followup by CTA or MRA. This recommendation follows 2010 ACCF/AHA/AATS/ACR/ASA/SCA/SCAI/SIR/STS/SVM Guidelines for the Diagnosis and Management of Patients with Thoracic Aortic Disease. Circulation. 2010; 121: Q762-U633. Aortic aneurysm NOS (ICD10-I71.9) 4. Coronary artery calcifications. Aortic Atherosclerosis (ICD10-I70.0). Electronically Signed   By: Keith Rake M.D.   On: 01/01/2020 19:30     None   Subjective: Patient was seen and examined at bedside.  No overnight events,  Patient reports chest pain has resolved with ibuprofen and colchicine.  Patient states he wants to be discharged home and he will follow-up with cardiology on Monday.  Discharge Exam: Vitals:   01/02/20 0300 01/02/20 1002  BP: 122/78 104/66  Pulse: 77 77  Resp: 19 19  Temp:    SpO2: 94% 96%   Vitals:   01/02/20 0100 01/02/20 0200 01/02/20 0300 01/02/20 1002  BP: 122/77 115/76 122/78 104/66  Pulse: 80 79 77 77  Resp: (!) 21 20 19 19   Temp: 98.7 F (37.1 C)     TempSrc: Oral     SpO2: 94% 95% 94% 96%  Weight:      Height:        General: Pt is alert, awake, not in acute distress Cardiovascular: RRR, S1/S2 +, no rubs, no gallops Respiratory: CTA bilaterally, no wheezing, no rhonchi Abdominal: Soft, NT, ND, bowel sounds + Extremities: no edema, no cyanosis    The results of significant diagnostics from this hospitalization (including imaging, microbiology, ancillary and laboratory) are listed below for reference.     Microbiology: Recent Results (from the past 240 hour(s))  SARS  Coronavirus 2 by RT PCR (hospital order, performed in [facility] hospital lab) Nasopharyngeal Nasopharyngeal Swab     Status: None   Collection Time: 01/01/20  8:24 PM   Specimen: Nasopharyngeal Swab  Result Value Ref Range Status   SARS Coronavirus 2 NEGATIVE NEGATIVE Final    Comment: (NOTE) SARS-CoV-2 target nucleic acids are NOT DETECTED.  The SARS-CoV-2 RNA is generally detectable in upper and lower respiratory specimens during the acute phase of infection. The lowest concentration of SARS-CoV-2 viral copies this assay can detect is 250 copies / mL. A negative result does not preclude SARS-CoV-2 infection and should not be used as the sole basis for treatment or other patient management decisions.  A negative result may occur with improper specimen collection / handling, submission of specimen other than nasopharyngeal swab, presence of viral mutation(s) within the areas targeted by this assay, and inadequate number of  viral copies (<250 copies / mL). A negative result must be combined with clinical observations, patient history, and epidemiological information.  Fact Sheet for Patients:   StrictlyIdeas.no  Fact Sheet for Healthcare Providers: BankingDealers.co.za  This test is not yet approved or  cleared by the [place] FDA and has been authorized for detection and/or diagnosis of SARS-CoV-2 by FDA under an Emergency Use Authorization (EUA).  This EUA will remain in effect (meaning this test can be used) for the duration of the COVID-19 declaration under Section 564(b)(1) of the Act, 21 U.S.C. section 360bbb-3(b)(1), unless the authorization is terminated or revoked sooner.  Performed at [facility], [address] [place], [place] [address]      Labs: BNP (last 3 results) No results for input(s): BNP in the last 8760 hours. Basic Metabolic Panel: Recent Labs  Lab 01/01/20 1825 01/02/20 0541  NA 138  138  K 3.8 3.7  CL 101 104  CO2 25 28  GLUCOSE 174* 161*  BUN 21 19  CREATININE 0.88 0.83  CALCIUM 8.8* 8.7*   Liver Function Tests: Recent Labs  Lab 01/02/20 0541  AST 19  ALT 20  ALKPHOS 56  BILITOT 0.9  PROT 6.6  ALBUMIN 3.5   No results for input(s): LIPASE, AMYLASE in the last 168 hours. No results for input(s): AMMONIA in the last 168 hours. CBC: Recent Labs  Lab 01/01/20 1825 01/02/20 0541  WBC 10.6* 9.4  HGB 14.5 12.7*  HCT 41.7 37.2*  MCV 87.6 90.7  PLT 141* 132*   Cardiac Enzymes: No results for input(s): CKTOTAL, CKMB, CKMBINDEX, TROPONINI in the last 168 hours. BNP: Invalid input(s): POCBNP CBG: No results for input(s): GLUCAP in the last 168 hours. D-Dimer No results for input(s): DDIMER in the last 72 hours. Hgb A1c No results for input(s): HGBA1C in the last 72 hours. Lipid Profile No results for input(s): CHOL, HDL, LDLCALC, TRIG, CHOLHDL, LDLDIRECT in the last 72 hours. Thyroid function studies Recent Labs    01/01/20 2025  TSH 1.769   Anemia work up No results for input(s): VITAMINB12, FOLATE, FERRITIN, TIBC, IRON, RETICCTPCT in the last 72 hours. Urinalysis    Component Value Date/Time   COLORURINE YELLOW (A) 12/27/2016 1116   APPEARANCEUR Clear 03/18/2018 1039   LABSPEC 1.019 12/27/2016 1116   PHURINE 6.0 12/27/2016 1116   GLUCOSEU Negative 03/18/2018 1039   HGBUR NEGATIVE 12/27/2016 1116   BILIRUBINUR Negative 03/18/2018 [place] 12/27/2016 1116   PROTEINUR Negative 03/18/2018 [place] 12/27/2016 1116   NITRITE Negative 03/18/2018 1039   NITRITE NEGATIVE 12/27/2016 1116   LEUKOCYTESUR Negative 03/18/2018 1039   Sepsis Labs Invalid input(s): PROCALCITONIN,  WBC,  LACTICIDVEN Microbiology Recent Results (from the past 240 hour(s))  SARS Coronavirus 2 by RT PCR (hospital order, performed in [place] hospital lab) Nasopharyngeal Nasopharyngeal Swab     Status: None   Collection Time:  01/01/20  8:24 PM   Specimen: Nasopharyngeal Swab  Result Value Ref Range Status   SARS Coronavirus 2 NEGATIVE NEGATIVE Final    Comment: (NOTE) SARS-CoV-2 target nucleic acids are NOT DETECTED.  The SARS-CoV-2 RNA is generally detectable in upper and lower respiratory specimens during the acute phase of infection. The lowest concentration of SARS-CoV-2 viral copies this assay can detect is 250 copies / mL. A negative result does not preclude SARS-CoV-2 infection and should not be used as the sole basis for treatment or other patient management decisions.  A negative  result may occur with improper specimen collection / handling, submission of specimen other than nasopharyngeal swab, presence of viral mutation(s) within the areas targeted by this assay, and inadequate number of viral copies (<250 copies / mL). A negative result must be combined with clinical observations, patient history, and epidemiological information.  Fact Sheet for Patients:   StrictlyIdeas.no  Fact Sheet for Healthcare Providers: BankingDealers.co.za  This test is not yet approved or  cleared by the [place] FDA and has been authorized for detection and/or diagnosis of SARS-CoV-2 by FDA under an Emergency Use Authorization (EUA).  This EUA will remain in effect (meaning this test can be used) for the duration of the COVID-19 declaration under Section 564(b)(1) of the Act, 21 U.S.C. section 360bbb-3(b)(1), unless the authorization is terminated or revoked sooner.  Performed at [facility], [address], [place], [place] [address]      Time coordinating discharge: Over 30 minutes  SIGNED:   Shawna Clamp, MD  [facility] 01/02/2020, 10:30 AM Pager   If 7PM-7AM, please contact night-coverage [email or web address]

## 2020-01-02 NOTE — ED Notes (Signed)
Pt asked about breakfast tray. Graham crackers given.

## 2020-01-02 NOTE — Discharge Instructions (Signed)
  Advised to follow-up with cardiology Dr. Earlyne Iba con on Monday @ 9am for echocardiogram.  Advised to take ibuprofen and colchicine as a prescribed for pericarditis. Advised to follow-up with primary care physician in 1 week.  Chest Wall Pain Chest wall pain is pain in or around the bones and muscles of your chest. Chest wall pain may be caused by:  An injury.  Coughing a lot.  Using your chest and arm muscles too much. Sometimes, the cause may not be known. This pain may take a few weeks or longer to get better. Follow these instructions at home: Managing pain, stiffness, and swelling If told, put ice on the painful area:  Put ice in a plastic bag.  Place a towel between your skin and the bag.  Leave the ice on for 20 minutes, 2-3 times a day.  Activity  Rest as told by your doctor.  Avoid doing things that cause pain. This includes lifting heavy items.  Ask your doctor what activities are safe for you. General instructions   Take over-the-counter and prescription medicines only as told by your doctor.  Do not use any products that contain nicotine or tobacco, such as cigarettes, e-cigarettes, and chewing tobacco. If you need help quitting, ask your doctor.  Keep all follow-up visits as told by your doctor. This is important. Contact a doctor if:  You have a fever.  Your chest pain gets worse.  You have new symptoms. Get help right away if:  You feel sick to your stomach (nauseous) or you throw up (vomit).  You feel sweaty or light-headed.  You have a cough with mucus from your lungs (sputum) or you cough up blood.  You are short of breath. These symptoms may be an emergency. Do not wait to see if the symptoms will go away. Get medical help right away. Call your local emergency services (911 in the [place] Do not drive yourself to the hospital. Summary  Chest wall pain is pain in or around the bones and muscles of your chest.  It may be treated with ice,  rest, and medicines. Your condition may also get better if you avoid doing things that cause pain.  Contact a doctor if you have a fever, chest pain that gets worse, or new symptoms.  Get help right away if you feel light-headed or you get short of breath. These symptoms may be an emergency. This information is not intended to replace advice given to you by your health care provider. Make sure you discuss any questions you have with your health care provider. Document Revised: 10/24/2017 Document Reviewed: 10/24/2017 Elsevier Patient Education  2020 [employer].

## 2020-01-04 DIAGNOSIS — I712 Thoracic aortic aneurysm, without rupture, unspecified: Secondary | ICD-10-CM | POA: Insufficient documentation

## 2020-01-27 ENCOUNTER — Ambulatory Visit (INDEPENDENT_AMBULATORY_CARE_PROVIDER_SITE_OTHER): Payer: Medicare Other | Admitting: Dermatology

## 2020-01-27 ENCOUNTER — Encounter: Payer: Self-pay | Admitting: Dermatology

## 2020-01-27 ENCOUNTER — Other Ambulatory Visit: Payer: Self-pay

## 2020-01-27 DIAGNOSIS — L814 Other melanin hyperpigmentation: Secondary | ICD-10-CM

## 2020-01-27 DIAGNOSIS — L918 Other hypertrophic disorders of the skin: Secondary | ICD-10-CM

## 2020-01-27 DIAGNOSIS — B353 Tinea pedis: Secondary | ICD-10-CM

## 2020-01-27 DIAGNOSIS — L820 Inflamed seborrheic keratosis: Secondary | ICD-10-CM

## 2020-01-27 DIAGNOSIS — D229 Melanocytic nevi, unspecified: Secondary | ICD-10-CM

## 2020-01-27 DIAGNOSIS — L570 Actinic keratosis: Secondary | ICD-10-CM

## 2020-01-27 DIAGNOSIS — B351 Tinea unguium: Secondary | ICD-10-CM | POA: Diagnosis not present

## 2020-01-27 DIAGNOSIS — L719 Rosacea, unspecified: Secondary | ICD-10-CM | POA: Diagnosis not present

## 2020-01-27 DIAGNOSIS — Z1283 Encounter for screening for malignant neoplasm of skin: Secondary | ICD-10-CM | POA: Diagnosis not present

## 2020-01-27 DIAGNOSIS — D1800 Hemangioma unspecified site: Secondary | ICD-10-CM

## 2020-01-27 DIAGNOSIS — L578 Other skin changes due to chronic exposure to nonionizing radiation: Secondary | ICD-10-CM

## 2020-01-27 DIAGNOSIS — L821 Other seborrheic keratosis: Secondary | ICD-10-CM

## 2020-01-27 MED ORDER — JUBLIA 10 % EX SOLN: CUTANEOUS | 2 refills | Status: DC

## 2020-01-27 MED ORDER — KETOCONAZOLE 2 % EX CREA: 1.0000 "application " | TOPICAL_CREAM | Freq: Two times a day (BID) | CUTANEOUS | 6 refills | Status: AC

## 2020-01-27 NOTE — Patient Instructions (Signed)
Recommend daily broad spectrum sunscreen SPF 30+ to sun-exposed areas, reapply every 2 hours as needed. Call for new or changing lesions.  Cryotherapy Aftercare  . Wash gently with soap and water everyday.   . Apply Vaseline and Band-Aid daily until healed.  

## 2020-01-27 NOTE — Progress Notes (Signed)
Follow-Up Visit   Subjective  Eric Hanna is a 74 y.o. male who presents for the following: Annual Exam (yearly TBSE, pt report no known  hx of skin cancer ). Pt concerned about lesions on his neck growing.  The patient presents for Total-Body Skin Exam (TBSE) for skin cancer screening and mole check.  The following portions of the chart were reviewed this encounter and updated as appropriate:  Tobacco  Allergies  Meds  Problems  Med Hx  Surg Hx  Fam Hx     Review of Systems:  No other skin or systemic complaints except as noted in HPI or Assessment and Plan.  Objective  Well appearing patient in no apparent distress; mood and affect are within normal limits.  A full examination was performed including scalp, head, eyes, ears, nose, lips, neck, chest, axillae, abdomen, back, buttocks, bilateral upper extremities, bilateral lower extremities, hands, feet, fingers, toes, fingernails, and toenails. All findings within normal limits unless otherwise noted below.  Objective  feet: Scaling and maceration web spaces and over distal and lateral soles.   Objective  feet: Nail dystrophy   Objective  Head - Anterior (Face) (6): Erythematous thin papules/macules with gritty scale.   Objective  L lower eyelid x 1, neck x 18 (19): Erythematous keratotic or waxy stuck-on papule or plaque.   Objective  Head - Anterior (Face): Mid face erythema with telangiectasias +/- scattered inflammatory papules.    Assessment & Plan  Tinea pedis of left foot feet Cont Ketoconazole 2% cream apply to feet daily  Pt declines Lamisil tablets because of other medications he has to take daily may interact  Ordered Medications: ketoconazole (NIZORAL) 2 % cream  Tinea unguium Feet; nails Start Jublia apply to nails daily  Ordered Medications: Efinaconazole (JUBLIA) 10 % SOLN  AK (actinic keratosis) (6) Head - Anterior (Face)  Destruction of lesion - Head - Anterior (Face) Complexity:  simple   Destruction method: cryotherapy   Informed consent: discussed and consent obtained   Timeout:  patient name, date of birth, surgical site, and procedure verified Lesion destroyed using liquid nitrogen: Yes   Region frozen until ice ball extended beyond lesion: Yes   Outcome: patient tolerated procedure well with no complications   Post-procedure details: wound care instructions given    Inflamed seborrheic keratosis (19) L lower eyelid x 1, neck x 18  Destruction of lesion - L lower eyelid x 1, neck x 18 Complexity: simple   Destruction method: cryotherapy   Informed consent: discussed and consent obtained   Timeout:  patient name, date of birth, surgical site, and procedure verified Lesion destroyed using liquid nitrogen: Yes   Region frozen until ice ball extended beyond lesion: Yes   Outcome: patient tolerated procedure well with no complications   Post-procedure details: wound care instructions given    Rosacea Head - Anterior (Face) Recommend laser treatment  Pt will call to let us know if he would like laser treatment  Lentigines - Scattered tan macules - Discussed due to sun exposure - Benign, observe - Call for any changes  Seborrheic Keratoses - Stuck-on, waxy, tan-brown papules and plaques  - Discussed benign etiology and prognosis. - Observe - Call for any changes  Melanocytic Nevi - Tan-brown and/or pink-flesh-colored symmetric macules and papules - Benign appearing on exam today - Observation - Call clinic for new or changing moles - Recommend daily use of broad spectrum spf 30+ sunscreen to sun-exposed areas.   Hemangiomas - Red papules -  Discussed benign nature - Observe - Call for any changes  Actinic Damage - diffuse scaly erythematous macules with underlying dyspigmentation - Recommend daily broad spectrum sunscreen SPF 30+ to sun-exposed areas, reapply every 2 hours as needed.  - Call for new or changing lesions.  Skin cancer  screening performed today.  Acrochordons (Skin Tags) - Fleshy, skin-colored pedunculated papules - Benign appearing.  - Observe. - If desired, they can be removed with an in office procedure that is not covered by insurance. - Please call the clinic if you notice any new or changing lesions.  Return in about 1 year (around 01/26/2021) for TBSE .  IMarye Round, CMA, am acting as scribe for Sarina Ser, MD .  Documentation: I have reviewed the above documentation for accuracy and completeness, and I agree with the above.  Sarina Ser, MD

## 2020-03-31 DIAGNOSIS — R9439 Abnormal result of other cardiovascular function study: Secondary | ICD-10-CM | POA: Insufficient documentation

## 2020-08-05 ENCOUNTER — Emergency Department: Payer: Medicare Other

## 2020-08-05 ENCOUNTER — Other Ambulatory Visit: Payer: Self-pay

## 2020-08-05 ENCOUNTER — Emergency Department
Admission: EM | Admit: 2020-08-05 | Discharge: 2020-08-05 | Disposition: A | Discharge status: Home / Self Care | Payer: Medicare Other | Attending: Emergency Medicine | Admitting: Emergency Medicine

## 2020-08-05 ENCOUNTER — Encounter: Payer: Self-pay | Admitting: Emergency Medicine

## 2020-08-05 DIAGNOSIS — Z7982 Long term (current) use of aspirin: Secondary | ICD-10-CM | POA: Insufficient documentation

## 2020-08-05 DIAGNOSIS — Z8551 Personal history of malignant neoplasm of bladder: Secondary | ICD-10-CM | POA: Diagnosis not present

## 2020-08-05 DIAGNOSIS — R509 Fever, unspecified: Secondary | ICD-10-CM

## 2020-08-05 DIAGNOSIS — E119 Type 2 diabetes mellitus without complications: Secondary | ICD-10-CM | POA: Diagnosis not present

## 2020-08-05 DIAGNOSIS — Z87891 Personal history of nicotine dependence: Secondary | ICD-10-CM | POA: Insufficient documentation

## 2020-08-05 DIAGNOSIS — Z20822 Contact with and (suspected) exposure to covid-19: Secondary | ICD-10-CM | POA: Diagnosis not present

## 2020-08-05 DIAGNOSIS — I2510 Atherosclerotic heart disease of native coronary artery without angina pectoris: Secondary | ICD-10-CM | POA: Diagnosis not present

## 2020-08-05 DIAGNOSIS — R531 Weakness: Secondary | ICD-10-CM | POA: Diagnosis present

## 2020-08-05 DIAGNOSIS — I10 Essential (primary) hypertension: Secondary | ICD-10-CM | POA: Insufficient documentation

## 2020-08-05 DIAGNOSIS — Z79899 Other long term (current) drug therapy: Secondary | ICD-10-CM | POA: Insufficient documentation

## 2020-08-05 DIAGNOSIS — E860 Dehydration: Secondary | ICD-10-CM | POA: Insufficient documentation

## 2020-08-05 DIAGNOSIS — Z951 Presence of aortocoronary bypass graft: Secondary | ICD-10-CM | POA: Insufficient documentation

## 2020-08-05 LAB — COMPREHENSIVE METABOLIC PANEL
ALT: 26 U/L (ref 0–44)
AST: 28 U/L (ref 15–41)
Albumin: 3.8 g/dL (ref 3.5–5.0)
Alkaline Phosphatase: 62 U/L (ref 38–126)
Anion gap: 10 (ref 5–15)
BUN: 23 mg/dL (ref 8–23)
CO2: 22 mmol/L (ref 22–32)
Calcium: 9.1 mg/dL (ref 8.9–10.3)
Chloride: 105 mmol/L (ref 98–111)
Creatinine, Ser: 1.03 mg/dL (ref 0.61–1.24)
GFR, Estimated: >60 mL/min (ref >60)
Glucose, Bld: 198 mg/dL — ABNORMAL HIGH (ref 70–99)
Potassium: 3.8 mmol/L (ref 3.5–5.1)
Sodium: 137 mmol/L (ref 135–145)
Total Bilirubin: 0.7 mg/dL (ref 0.3–1.2)
Total Protein: 7 g/dL (ref 6.5–8.1)

## 2020-08-05 LAB — CBC WITH DIFFERENTIAL/PLATELET
Abs Immature Granulocytes: 0.02 10*3/uL (ref 0.00–0.07)
Basophils Absolute: 0 10*3/uL (ref 0.0–0.1)
Basophils Relative: 0 %
Eosinophils Absolute: 0 10*3/uL (ref 0.0–0.5)
Eosinophils Relative: 0 %
HCT: 44.4 % (ref 39.0–52.0)
Hemoglobin: 14.9 g/dL (ref 13.0–17.0)
Immature Granulocytes: 0 %
Lymphocytes Relative: 4 %
Lymphs Abs: 0.2 10*3/uL — ABNORMAL LOW (ref 0.7–4.0)
MCH: 30 pg (ref 26.0–34.0)
MCHC: 33.6 g/dL (ref 30.0–36.0)
MCV: 89.5 fL (ref 80.0–100.0)
Monocytes Absolute: 0.4 10*3/uL (ref 0.1–1.0)
Monocytes Relative: 7 %
Neutro Abs: 5.5 10*3/uL (ref 1.7–7.7)
Neutrophils Relative %: 89 %
Platelets: 135 10*3/uL — ABNORMAL LOW (ref 150–400)
RBC: 4.96 MIL/uL (ref 4.22–5.81)
RDW: 13.2 % (ref 11.5–15.5)
WBC: 6.2 10*3/uL (ref 4.0–10.5)
nRBC: 0 % (ref 0.0–0.2)

## 2020-08-05 LAB — PROTIME-INR
INR: 1.1 (ref 0.8–1.2)
Prothrombin Time: 14.1 seconds (ref 11.4–15.2)

## 2020-08-05 LAB — URINALYSIS, COMPLETE (UACMP) WITH MICROSCOPIC
Bacteria, UA: NONE SEEN
Bilirubin Urine: NEGATIVE
Glucose, UA: NEGATIVE mg/dL
Hgb urine dipstick: NEGATIVE
Ketones, ur: NEGATIVE mg/dL
Leukocytes,Ua: NEGATIVE
Nitrite: NEGATIVE
Protein, ur: NEGATIVE mg/dL
Specific Gravity, Urine: 1.017 (ref 1.005–1.030)
Squamous Epithelial / HPF: NONE SEEN (ref 0–5)
pH: 5 (ref 5.0–8.0)

## 2020-08-05 LAB — RESP PANEL BY RT-PCR (FLU A&B, COVID) ARPGX2
Influenza A by PCR: NEGATIVE
Influenza B by PCR: NEGATIVE
SARS Coronavirus 2 by RT PCR: NEGATIVE

## 2020-08-05 LAB — LACTIC ACID, PLASMA
Lactic Acid, Venous: 2.4 mmol/L (ref 0.5–1.9)
Lactic Acid, Venous: 2.1 mmol/L (ref 0.5–1.9)

## 2020-08-05 LAB — APTT: aPTT: 28 seconds (ref 24–36)

## 2020-08-05 MED ORDER — AMOXICILLIN-POT CLAVULANATE 875-125 MG PO TABS: 1.0000 | ORAL_TABLET | Freq: Two times a day (BID) | ORAL | 0 refills | Status: AC

## 2020-08-05 MED ORDER — METRONIDAZOLE IN NACL 5-0.79 MG/ML-% IV SOLN
500.0000 mg | Freq: Once | INTRAVENOUS | Status: AC
  Administered 2020-08-05: 500 mg via INTRAVENOUS
  Filled 2020-08-05: qty 100

## 2020-08-05 MED ORDER — VANCOMYCIN HCL IN DEXTROSE 1-5 GM/200ML-% IV SOLN
1000.0000 mg | Freq: Once | INTRAVENOUS | Status: AC
  Administered 2020-08-05: 1000 mg via INTRAVENOUS
  Filled 2020-08-05: qty 200

## 2020-08-05 MED ORDER — SODIUM CHLORIDE 0.9 % IV BOLUS
1000.0000 mL | Freq: Once | INTRAVENOUS | Status: AC
  Administered 2020-08-05: 1000 mL via INTRAVENOUS

## 2020-08-05 MED ORDER — ONDANSETRON 4 MG PO TBDP: 4.0000 mg | ORAL_TABLET | Freq: Three times a day (TID) | ORAL | 0 refills | Status: DC | PRN

## 2020-08-05 MED ORDER — KETOROLAC TROMETHAMINE 30 MG/ML IJ SOLN
15.0000 mg | Freq: Once | INTRAMUSCULAR | Status: AC
  Administered 2020-08-05: 15 mg via INTRAVENOUS
  Filled 2020-08-05: qty 1

## 2020-08-05 MED ORDER — LACTATED RINGERS IV SOLN: INTRAVENOUS | Status: DC

## 2020-08-05 MED ORDER — LACTATED RINGERS IV BOLUS (SEPSIS)
1000.0000 mL | Freq: Once | INTRAVENOUS | Status: AC
  Administered 2020-08-05: 1000 mL via INTRAVENOUS

## 2020-08-05 MED ORDER — SODIUM CHLORIDE 0.9 % IV SOLN
2.0000 g | Freq: Once | INTRAVENOUS | Status: AC
  Administered 2020-08-05: 2 g via INTRAVENOUS
  Filled 2020-08-05: qty 2

## 2020-08-05 MED ORDER — AZITHROMYCIN 250 MG PO TABS: ORAL_TABLET | ORAL | 0 refills | Status: DC

## 2020-08-05 MED ORDER — ACETAMINOPHEN 325 MG PO TABS
650.0000 mg | ORAL_TABLET | Freq: Once | ORAL | Status: AC
  Administered 2020-08-05: 650 mg via ORAL
  Filled 2020-08-05: qty 2

## 2020-08-05 NOTE — Progress Notes (Signed)
CODE SEPSIS - PHARMACY COMMUNICATION  **Broad Spectrum Antibiotics should be administered within 1 hour of Sepsis diagnosis**  Time Code Sepsis Called/Page Received: 1116  Antibiotics Ordered: Vancomycin,Cefepime  Time of 1st antibiotic administration: 9249  Additional action taken by pharmacy: none  If necessary, Name of Provider/Nurse Contacted: n/a    Pearla Dubonnet ,PharmD Clinical Pharmacist  08/05/2020  12:33 PM

## 2020-08-05 NOTE — ED Notes (Signed)
Pt given light sheet

## 2020-08-05 NOTE — ED Notes (Addendum)
Pt c/o weakness since last night. Pt states he felt so weak he could not get out of bed without help this morning. Pt states he wears a c-pap and the settings were not changed but his wife noted abnormally shallow breathing in the night. Pt denies cough, SOB, body aches, and abnormal urination. Pt states he has fever blister on his lips but no other wounds, denies exposure to known illness. Breath sounds clear bilaterally, respirations even and unlabored. PT AOX4, NAD noted.

## 2020-08-05 NOTE — ED Notes (Signed)
Wife notified by phone that she can come inside to be with pt at pt's request

## 2020-08-05 NOTE — Sepsis Progress Note (Signed)
Pt was discharged home.  2L IV fluid resuscitation  and 3 antibiotics were given. LA 2.4 to 2.1. VSS.

## 2020-08-05 NOTE — ED Provider Notes (Signed)
[facility] Emergency Department Provider Note  ____________________________________________   Event Date/Time   First MD Initiated Contact with Patient 08/05/20 1112     (approximate)  I have reviewed the triage vital signs and the nursing notes.   HISTORY  Chief Complaint Weakness    HPI Eric Hanna is a 76 y.o. male with past medical history as below here with fever, weakness.  Patient states that over the last 24 hours, he has had acute onset of significant weakness.  He states he felt fine yesterday, but when he tried to get up out of bed this morning, he felt incredibly weak.  He went back to bed.  When try to get back up later the morning, he states he felt so weak his wife had to help him up on a bed.  He felt like he was too weak to even keep his body upright.  Denies any focal weakness.  No numbness.  No speech difficulty.  He states that he had a small bite to eat but otherwise did not want to eat.  States he has felt increasingly improved throughout the day.  Denies any preceding fevers or chills.  No recent medication changes.  He has a fever but did not know this until he checked into the ED.  He said some chills.  No known sick contacts.        Past Medical History:  Diagnosis Date  . Coronary artery disease   . DDD (degenerative disc disease), cervical   . Depression   . Diabetes mellitus without complication ([place])    borderline; no meds; no blood sugar checks  . Diarrhea   . GERD (gastroesophageal reflux disease)   . Hypertension   . Hypertrophy of prostate   . Impotence, organic   . Lower urinary tract symptoms (LUTS)   . Lumbar trigger point syndrome   . Malignant neoplasm of bladder ([place])   . Myocardial infarction ([place]) 2000  . Obesity   . OSA (obstructive sleep apnea)    CPAP  . Osteoarthritis   . Sleep apnea   . Spondylolisthesis of cervical region   . Testicular hypofunction   . WPW (Wolff-Parkinson-White syndrome)      Patient Active Problem List   Diagnosis Date Noted  . Pericardial effusion 01/01/2020  . CAD (coronary artery disease), native coronary artery 01/31/2014  . Obesity, unspecified 01/31/2014  . Obstructive sleep apnea (adult) (pediatric) 01/31/2014  . Myocardial infarction ([place]) 01/07/2014  . 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 Hypertrophy of prostate without urinary obstruction and other lower urinary tract symptoms (LUTS) 03/26/2012  . Hypogonadism, male 03/26/2012    Past Surgical History:  Procedure Laterality Date  . CARDIAC CATHETERIZATION    . CATARACT EXTRACTION W/PHACO Left 06/05/2017   Procedure: CATARACT EXTRACTION PHACO AND INTRAOCULAR LENS PLACEMENT ([place]) LEFT;  Surgeon: Leandrew Koyanagi, MD;  Location: [place];  Service: Ophthalmology;  Laterality: Left;  sleep apnea  . CATARACT EXTRACTION W/PHACO Right 07/03/2017   Procedure: CATARACT EXTRACTION PHACO AND INTRAOCULAR LENS PLACEMENT ([place]) RIGHT;  Surgeon: Leandrew Koyanagi, MD;  Location: [place];  Service: Ophthalmology;  Laterality: Right;  sleep apnea  . CHIN IMPLANT     . COLONOSCOPY    . COLONOSCOPY WITH PROPOFOL N/A 09/07/2015   Procedure: COLONOSCOPY WITH PROPOFOL;  Surgeon: Manya Silvas, MD;  Location: [facility];  Service: Endoscopy;  Laterality: N/A;  . HAND SURGERY    . STOMACH REDUCTION WITHOUT BYPASS    .  X-STOP IMPLANTATION      Prior to Admission medications   Medication Sig Start Date End Date Taking? Authorizing Provider  amoxicillin-clavulanate (AUGMENTIN) 875-125 MG tablet Take 1 tablet by mouth 2 (two) times daily for 7 days. 08/05/20 08/12/20 Yes Duffy Bruce, MD  azithromycin (ZITHROMAX Z-PAK) 250 MG tablet Take 2 tablets (500 mg) on  Day 1,  followed by 1 tablet (250 mg) once daily on Days 2 through 5. 08/05/20  Yes Duffy Bruce, MD  ondansetron (ZOFRAN ODT) 4 MG disintegrating tablet Take 1 tablet (4 mg total) by mouth every 8 (eight) hours as needed for  nausea or vomiting. 08/05/20  Yes Duffy Bruce, MD  alendronate (FOSAMAX) 70 MG tablet Take 70 mg by mouth once a week. Take with a full glass of water on an empty stomach.    [provider]  aspirin 81 MG tablet Take 81 mg by mouth daily.    [provider]  calcium citrate-vitamin D (CITRACAL+D) 315-200 MG-UNIT tablet Take 1 tablet by mouth daily.    [provider]  cetirizine (ZYRTEC) 10 MG tablet Take 10 mg by mouth daily.    [provider]  COENZYME Q10 PO Take 1 capsule by mouth daily.    [provider]  colchicine 0.6 MG tablet Take 1 tablet (0.6 mg total) by mouth 2 (two) times daily. 01/02/20   Shawna Clamp, MD  Efinaconazole (JUBLIA) 10 % SOLN Apply to nails daily 01/27/20   Ralene Bathe, MD  ibuprofen (ADVIL) 600 MG tablet Take 1 tablet (600 mg total) by mouth 3 (three) times daily. 01/02/20   Shawna Clamp, MD  metoprolol succinate (TOPROL-XL) 25 MG 24 hr tablet Take 25 mg by mouth daily. 11/12/19   [provider]  Multiple Vitamin (MULTIVITAMIN) capsule Take 1 capsule by mouth daily.    [provider]  Omega-3 Fatty Acids (FISH OIL) 1200 MG CPDR Take by mouth daily.    [provider]  omeprazole (PRILOSEC) 20 MG capsule Take 20 mg by mouth daily.    [provider]  predniSONE (DELTASONE) 20 MG tablet Take 20 mg by mouth 2 (two) times daily. 01/01/20   [provider]  sildenafil (VIAGRA) 100 MG tablet Take 100 mg by mouth daily as needed for erectile dysfunction. Patient not taking: Reported on 01/01/2020    [provider]  simvastatin (ZOCOR) 80 MG tablet Take 80 mg by mouth daily.    [provider]  traZODone (DESYREL) 50 MG tablet Take 50 mg by mouth at bedtime. 11/12/16   [provider]    Allergies Patient has no known allergies.  History reviewed. No pertinent family history.  Social History Social History   Tobacco Use  . Smoking status:  Former Smoker    Quit date: 2000    Years since quitting: 22.2  . Smokeless tobacco: Never Used  Vaping Use  . Vaping Use: Never used  Substance Use Topics  . Alcohol use: No  . Drug use: No    Review of Systems  Review of Systems  Constitutional: Positive for chills, fatigue and fever.  HENT: Negative for sore throat.   Respiratory: Negative for shortness of breath.   Cardiovascular: Negative for chest pain.  Gastrointestinal: Negative for abdominal pain.  Genitourinary: Negative for flank pain.  Musculoskeletal: Negative for neck pain.  Skin: Negative for rash and wound.  Allergic/Immunologic: Negative for immunocompromised state.  Neurological: Positive for weakness. Negative for numbness.  Hematological: Does not bruise/bleed  easily.  All other systems reviewed and are negative.    ____________________________________________  PHYSICAL EXAM:      VITAL SIGNS: ED Triage Vitals [08/05/20 1104]  Enc Vitals Group     BP 101/75     Pulse Rate (!) 105     Resp 18     Temp (!) 100.6 F (38.1 C)     Temp Source Oral     SpO2 95 %     Weight 250 lb (113.4 kg)     Height 6\' 1"  (1.854 m)     Head Circumference      Peak Flow      Pain Score 2     Pain Loc      Pain Edu?      Excl. in [place]?      Physical Exam Vitals and nursing note reviewed.  Constitutional:      General: He is not in acute distress.    Appearance: He is well-developed.  HENT:     Head: Normocephalic and atraumatic.     Mouth/Throat:     Mouth: Mucous membranes are dry.  Eyes:     Conjunctiva/sclera: Conjunctivae normal.  Cardiovascular:     Rate and Rhythm: Normal rate and regular rhythm.     Heart sounds: Normal heart sounds. No murmur heard. No friction rub.  Pulmonary:     Effort: Pulmonary effort is normal. No respiratory distress.     Breath sounds: Normal breath sounds. No wheezing or rales.  Abdominal:     General: There is no distension.     Palpations: Abdomen is soft.      Tenderness: There is no abdominal tenderness.  Musculoskeletal:     Cervical back: Neck supple.  Skin:    General: Skin is warm.     Capillary Refill: Capillary refill takes less than 2 seconds.  Neurological:     Mental Status: He is alert and oriented to person, place, and time.     Motor: No abnormal muscle tone.       ____________________________________________   LABS (all labs ordered are listed, but only abnormal results are displayed)  Labs Reviewed  LACTIC ACID, PLASMA - Abnormal; Notable for the following components:      Result Value   Lactic Acid, Venous 2.4 (*)    All other components within normal limits  LACTIC ACID, PLASMA - Abnormal; Notable for the following components:   Lactic Acid, Venous 2.1 (*)    All other components within normal limits  COMPREHENSIVE METABOLIC PANEL - Abnormal; Notable for the following components:   Glucose, Bld 198 (*)    All other components within normal limits  CBC WITH DIFFERENTIAL/PLATELET - Abnormal; Notable for the following components:   Platelets 135 (*)    Lymphs Abs 0.2 (*)    All other components within normal limits  URINALYSIS, COMPLETE (UACMP) WITH MICROSCOPIC - Abnormal; Notable for the following components:   Color, Urine YELLOW (*)    APPearance CLEAR (*)    All other components within normal limits  RESP PANEL BY RT-PCR (FLU A&B, COVID) ARPGX2  CULTURE, BLOOD (ROUTINE X 2)  CULTURE, BLOOD (ROUTINE X 2)  URINE CULTURE  PROTIME-INR  APTT    ____________________________________________  EKG: Normal sinus rhythm, ventricular rate 96.  PR 193, QRS 93, QTc 453.  No acute ST elevations or depressions. ________________________________________  RADIOLOGY All imaging, including plain films, CT scans, and ultrasounds, independently reviewed by me, and interpretations confirmed via formal radiology  reads.  ED MD interpretation:   Chest x-ray: No edema or airspace opacity  Official radiology report(s): DG  Chest Port 1 View  Result Date: 08/05/2020 CLINICAL DATA:  Weakness EXAM: PORTABLE CHEST 1 VIEW COMPARISON:  January 01, 2020 chest CT and CT angiogram chest FINDINGS: No edema or airspace opacity. Heart is borderline enlarged with pulmonary vascularity normal. No adenopathy. There is an old healed fracture of the left clavicle. There is degenerative change in the right shoulder. IMPRESSION: No edema or airspace opacity.  Heart borderline prominent. Electronically Signed   By: Lowella Grip III M.D.   On: 08/05/2020 11:49    ____________________________________________  PROCEDURES   Procedure(s) performed (including Critical Care):  Procedures  ____________________________________________  INITIAL IMPRESSION / MDM / [place] / ED COURSE  As part of my medical decision making, I reviewed the following data within the [place] notes reviewed and incorporated, Old chart reviewed, Notes from prior ED visits, and [place] Controlled Substance Database       *Eric Hanna was evaluated in Emergency Department on 08/05/2020 for the symptoms described in the history of present illness. He was evaluated in the context of the global COVID-19 pandemic, which necessitated consideration that the patient might be at risk for infection with the SARS-CoV-2 virus that causes COVID-19. Institutional protocols and algorithms that pertain to the evaluation of patients at risk for COVID-19 are in a state of rapid change based on information released by regulatory bodies including the CDC and federal and state organizations. These policies and algorithms were followed during the patient's care in the ED.  Some ED evaluations and interventions may be delayed as a result of limited staffing during the pandemic.*     Medical Decision Making: 75 year old male here with generalized weakness.  Patient febrile and borderline hypotensive on arrival.  He is nontoxic however.  Chest  x-ray shows no edema or airspace opacity.  Lab work is overall reassuring with normal white count, normal renal function and LFTs.  He is Covid negative.  Influenza negative.  INR is 1.1.  Lactic acid is pending.  Will start fluids, reassess.  Lactic acid downtrending after fluids, 2.4-2.1 despite being given lactated Ringer's, which is very reassuring.  He has been given additional normal saline bolus.  Patient ambulatory throughout the ED after fluids and feels markedly improved.  He is not hypoxic.  His only complaint is a mild cough.  Suspect he likely has a viral illness, with dehydration.  Will encourage hydration at home.  Given his initial mild lactic acid elevation and cough with coarse breath sounds, will cover for possible early community-acquired pneumonia.  I do long discussion with him and his wife regarding indications for returning, hydrating at home, and close follow-up with his PCP.  ____________________________________________  FINAL CLINICAL IMPRESSION(S) / ED DIAGNOSES  Final diagnoses:  Generalized weakness  Fever in adult  Dehydration     MEDICATIONS GIVEN DURING THIS VISIT:  Medications  lactated ringers infusion ( Intravenous Stopped 08/05/20 1508)  lactated ringers bolus 1,000 mL (0 mLs Intravenous Stopped 08/05/20 1253)  ceFEPIme (MAXIPIME) 2 g in sodium chloride 0.9 % 100 mL IVPB (0 g Intravenous Stopped 08/05/20 1233)  metroNIDAZOLE (FLAGYL) IVPB 500 mg (0 mg Intravenous Stopped 08/05/20 1341)  vancomycin (VANCOCIN) IVPB 1000 mg/200 mL premix (0 mg Intravenous Stopped 08/05/20 1501)  acetaminophen (TYLENOL) tablet 650 mg (650 mg Oral Given 08/05/20 1137)  sodium chloride 0.9 % bolus 1,000 mL (0 mLs  Intravenous Stopped 08/05/20 1342)  ketorolac (TORADOL) 30 MG/ML injection 15 mg (15 mg Intravenous Given 08/05/20 1500)     ED Discharge Orders         Ordered    amoxicillin-clavulanate (AUGMENTIN) 875-125 MG tablet  2 times daily        08/05/20 1455    ondansetron (ZOFRAN  ODT) 4 MG disintegrating tablet  Every 8 hours PRN        08/05/20 1455    azithromycin (ZITHROMAX Z-PAK) 250 MG tablet        08/05/20 1455           Note:  This document was prepared using Dragon voice recognition software and may include unintentional dictation errors.   Duffy Bruce, MD 08/05/20 [phone]

## 2020-08-05 NOTE — Progress Notes (Signed)
PHARMACY -  BRIEF ANTIBIOTIC NOTE   Pharmacy has received consult(s) for Vancomycin and Cefepime from an ED provider.  The patient's profile has been reviewed for ht/wt/allergies/indication/available labs.    One time order(s) placed for Vancomycin 1g IV and Cefepime 2g IV x 1 dose each.  Further antibiotics/pharmacy consults should be ordered by admitting physician if indicated.                       Thank you, Pearla Dubonnet 08/05/2020  11:16 AM

## 2020-08-05 NOTE — ED Notes (Signed)
Ambulated pt in the hallway. Pt is able to stand and walk without assistance, c/o muscle soreness but states he feels much better than this morning.

## 2020-08-05 NOTE — ED Triage Notes (Signed)
Pt comes into the ED via POV c/o generalized weakness.  Pt states normally he is ambulatory with no problems, but today he had to call for his wife to help get him out of the bed because the weakness was so bad.  Pt does normally wear a CPAP and per his wife he was breathing more shallow than normal.  Pt denies any CP, SHOB, dizziness, etc currently.  Pt currently has even and unlabored respirations and is A&Ox4.

## 2020-08-05 NOTE — Sepsis Progress Note (Signed)
Code Sepsis protocol being monitored by eLink. 

## 2020-08-07 LAB — URINE CULTURE: Culture: NO GROWTH

## 2020-08-10 LAB — CULTURE, BLOOD (ROUTINE X 2)
Culture: NO GROWTH
Culture: NO GROWTH
Special Requests: ADEQUATE

## 2021-01-30 ENCOUNTER — Ambulatory Visit (INDEPENDENT_AMBULATORY_CARE_PROVIDER_SITE_OTHER): Payer: Medicare Other | Admitting: Dermatology

## 2021-01-30 ENCOUNTER — Other Ambulatory Visit: Payer: Self-pay

## 2021-01-30 DIAGNOSIS — D229 Melanocytic nevi, unspecified: Secondary | ICD-10-CM

## 2021-01-30 DIAGNOSIS — L820 Inflamed seborrheic keratosis: Secondary | ICD-10-CM | POA: Diagnosis not present

## 2021-01-30 DIAGNOSIS — Z1283 Encounter for screening for malignant neoplasm of skin: Secondary | ICD-10-CM | POA: Diagnosis not present

## 2021-01-30 DIAGNOSIS — L814 Other melanin hyperpigmentation: Secondary | ICD-10-CM

## 2021-01-30 DIAGNOSIS — L821 Other seborrheic keratosis: Secondary | ICD-10-CM

## 2021-01-30 DIAGNOSIS — L719 Rosacea, unspecified: Secondary | ICD-10-CM | POA: Diagnosis not present

## 2021-01-30 DIAGNOSIS — L578 Other skin changes due to chronic exposure to nonionizing radiation: Secondary | ICD-10-CM

## 2021-01-30 DIAGNOSIS — B353 Tinea pedis: Secondary | ICD-10-CM

## 2021-01-30 DIAGNOSIS — D1800 Hemangioma unspecified site: Secondary | ICD-10-CM

## 2021-01-30 MED ORDER — TERBINAFINE HCL 250 MG PO TABS: 250.0000 mg | ORAL_TABLET | Freq: Every day | ORAL | 0 refills | Status: DC

## 2021-01-30 NOTE — Progress Notes (Signed)
Follow-Up Visit   Subjective  Eric Hanna is a 75 y.o. male who presents for the following: Annual Exam (Patient has a lesion on his R med thigh that he he scratched off and is now crusted). The patient presents for Total-Body Skin Exam (TBSE) for skin cancer screening and mole check.  The following portions of the chart were reviewed this encounter and updated as appropriate:   Tobacco  Allergies  Meds  Problems  Med Hx  Surg Hx  Fam Hx     Review of Systems:  No other skin or systemic complaints except as noted in HPI or Assessment and Plan.  Objective  Well appearing patient in no apparent distress; mood and affect are within normal limits.  A full examination was performed including scalp, head, eyes, ears, nose, lips, neck, chest, axillae, abdomen, back, buttocks, bilateral upper extremities, bilateral lower extremities, hands, feet, fingers, toes, fingernails, and toenails. All findings within normal limits unless otherwise noted below.  Face Erythema of the cheeks and nose.  Right Medial Thigh x 1 Erythematous keratotic or waxy stuck-on papule or plaque.   B/L foot Scaling and maceration web spaces and over distal and lateral soles.    Assessment & Plan  Rosacea Face Rosacea is a chronic progressive skin condition usually affecting the face of adults, causing redness and/or acne bumps. It is treatable but not curable. It sometimes affects the eyes (ocular rosacea) as well. It may respond to topical and/or systemic medication and can flare with stress, sun exposure, alcohol, exercise and some foods.  Daily application of broad spectrum spf 30+ sunscreen to face is recommended to reduce flares.  Discussed BBL/laser treatment. Advised not covered by insurance and it is an out of pocket cost of $350 per treatment session.  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 Patient no longer taking Colchicine.  Patient has no contraindication to treatment with oral. Start Lamisil 250mg  po QD. Terbinafine Counseling  Terbinafine is an anti-fungal medicine that can be applied to the skin (over the counter) or taken by mouth (prescription) to treat fungal infections. The pill version is often used to treat fungal infections of the nails or scalp. While most people do not have any side effects from taking terbinafine pills, some possible side effects of the medicine can include taste changes, headache, loss of smell, vision changes, nausea, vomiting, or diarrhea.   Rare side effects can include irritation of the liver, allergic reaction, or decrease in blood counts (which may show up as not feeling well or developing an infection). If you are concerned about any of these side effects, please stop the medicine and call your doctor, or in the case of an emergency such as feeling very unwell, seek immediate medical care.    terbinafine (LAMISIL) 250 MG tablet - B/L foot Take 1 tablet (250 mg total) by mouth daily.  Lentigines - Scattered tan macules - Due to sun exposure - Benign-appearing, observe - Recommend daily  broad spectrum sunscreen SPF 30+ to sun-exposed areas, reapply every 2 hours as needed. - Call for any changes  Seborrheic Keratoses - Stuck-on, waxy, tan-brown papules and/or plaques  - Benign-appearing - Discussed benign etiology and prognosis. - Observe - Call for any changes  Melanocytic Nevi - Tan-brown and/or  pink-flesh-colored symmetric macules and papules - Benign appearing on exam today - Observation - Call clinic for new or changing moles - Recommend daily use of broad spectrum spf 30+ sunscreen to sun-exposed areas.   Hemangiomas - Red papules - Discussed benign nature - Observe - Call for any changes  Actinic Damage - Chronic condition, secondary to cumulative UV/sun exposure - diffuse scaly erythematous macules with underlying dyspigmentation - Recommend daily broad spectrum sunscreen SPF 30+ to sun-exposed areas, reapply every 2 hours as needed.  - Staying in the shade or wearing long sleeves, sun glasses (UVA+UVB protection) and wide brim hats (4-inch brim around the entire circumference of the hat) are also recommended for sun protection.  - Call for new or changing lesions.  Acrochordons (Skin Tags) - Fleshy, skin-colored pedunculated papules - Benign appearing.  - Observe. - If desired, they can be removed with an in office procedure that is not covered by insurance. - Please call the clinic if you notice any new or changing lesions.  Skin cancer screening performed today.  Return in about 1 year (around 01/30/2022) for TBSE; 5-6 mths tinea f/u .  Luther Redo, CMA, am acting as scribe for Sarina Ser, MD . Documentation: I have reviewed the above documentation for accuracy and completeness, and I agree with the above.  Sarina Ser, MD

## 2021-01-30 NOTE — Patient Instructions (Signed)

## 2021-02-02 ENCOUNTER — Encounter: Payer: Self-pay | Admitting: Dermatology

## 2021-02-28 ENCOUNTER — Other Ambulatory Visit: Payer: Self-pay | Admitting: Dermatology

## 2021-02-28 DIAGNOSIS — B353 Tinea pedis: Secondary | ICD-10-CM

## 2021-03-01 NOTE — Telephone Encounter (Signed)
Unable to leave a voicemail since the voicemail box is full.

## 2021-03-02 NOTE — Telephone Encounter (Signed)
Spoke with pt and he states that there is some improvement but not yet clear. Will send refill.

## 2021-06-23 NOTE — Progress Notes (Signed)
Psychiatric Initial Adult Assessment   Patient Identification: Eric Hanna MRN:  [id number] Date of Evaluation:  06/27/2021 Referral Source: Kirk Ruths, MD  Chief Complaint:   Chief Complaint  Patient presents with   Establish Care  "I don't want to die with my grandchildren not respecting me" Visit Diagnosis:    ICD-10-CM   1. MDD (major depressive disorder), recurrent episode, mild (HCC)  F33.0       History of Present Illness:   Eric Hanna is a 76 y.o. year old male with a history of depression, anxiety, NSTEMI, WPW, hypertension, hyperlipidemia, acute idiopathic pericarditis, OSA, GERD, sleep apnea, malignant neoplasm of bladder s/p chemotherapy, s/p stomach reduction without bypass, who is referred for depression.   He states that he was treated for depression by Dr. Vallarie Mare 12 years ago.  He states that his wife thinks he needs help again.  Although he was irritated when he was first told about this, he now thinks that he may be over thinking about things more frequently.  He talks about the concern of how the world is going. He is also concerned about politics/election.  He also wants to "fix" the relationship with his daughter. He states that his daughter manipulates him and his wife, referring that they have been accommodating for her as she was their only child.  He reports conflict that his daughter once confronted them about how they raised her as a child in contrast to his grandchildren. He talks about his "3 beautiful grandchildren," who are their best friends with each other.   He states that he regrets that his daughter did not have any siblings like them.  He also states that he feels guilty that his daughter did not get involved in sports while these grandchildren are very athletics.  He notices that the way his grandchildren approach to him and his wife has been different; he assumes that they listen to one side of the story from his daughter.  He states that  their attitude has been the same to his wife as well.  He describes the relationship with his wife as "wonderful" except that she is concerned about him.  He enjoys meeting with his friends from high school, getting coffee in the morning.   He has depressive symptoms as in PHQ-9.  He still enjoys doing things even when he feels depressed at times.  He enjoys taking a walk, and has been trying to take a walk.  He feels better about himself since he lost weight.  He denies SI.  He drinks a beer only occasionally .  He denies drug use .   PTSD- Although he denies trauma, he talks about his father, who was very strict.  He felt that his focus was always something else when he was a child.  He states that the relationship with him resembles the relationship with his daughter.   Cognition-he reports occasional memory loss over the past several months.   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 Bridgett Larsson in 2010's for depression Psychiatry admission: denies Previous suicide attempt: denies  Past trials of medication: (does not recall) History of violence:    Previous Psychotropic Medications: Yes   Substance Abuse History in the last 12  months:  No.  Consequences of Substance Abuse: NA  Past Medical History:  Past Medical History:  Diagnosis Date   Coronary artery disease    DDD (degenerative disc disease), cervical    Depression    Diabetes mellitus without complication (HCC)    borderline; no meds; no blood sugar checks   Diarrhea    GERD (gastroesophageal reflux disease)    Hypertension    Hypertrophy of prostate    Impotence, organic    Lower urinary tract symptoms (LUTS)    Lumbar trigger point syndrome    Malignant neoplasm of bladder ([place])    Myocardial infarction ([place]) 2000   Obesity    OSA (obstructive sleep apnea)    CPAP   Osteoarthritis    Sleep apnea    Spondylolisthesis of cervical region    Testicular hypofunction    WPW (Wolff-Parkinson-White syndrome)     Past Surgical History:  Procedure Laterality Date   CARDIAC CATHETERIZATION     CATARACT EXTRACTION W/PHACO Left 06/05/2017   Procedure: CATARACT EXTRACTION PHACO AND INTRAOCULAR LENS PLACEMENT ([place]) LEFT;  Surgeon: Leandrew Koyanagi, MD;  Location: [facility];  Service: Ophthalmology;  Laterality: Left;  sleep apnea   CATARACT EXTRACTION W/PHACO Right 07/03/2017   Procedure: CATARACT EXTRACTION PHACO AND INTRAOCULAR LENS PLACEMENT ([place]) RIGHT;  Surgeon: Leandrew Koyanagi, MD;  Location: [facility];  Service: Ophthalmology;  Laterality: Right;  sleep apnea   CHIN IMPLANT      COLONOSCOPY     COLONOSCOPY WITH PROPOFOL N/A 09/07/2015   Procedure: COLONOSCOPY WITH PROPOFOL;  Surgeon: Manya Silvas, MD;  Location: [facility] ENDOSCOPY;  Service: Endoscopy;  Laterality: N/A;   HAND SURGERY     STOMACH REDUCTION WITHOUT BYPASS     X-STOP IMPLANTATION      Family Psychiatric History: denies  Family History: History reviewed. No pertinent family history.  Social History:   Social History   Socioeconomic History   Marital status: Married    Spouse name: donna   Number of children: 1   Years of education: Not on file    Highest education level: Some college, no degree  Occupational History   Not on file  Tobacco Use   Smoking status: Former    Types: Cigarettes    Quit date: 2000    Years since quitting: 23.1   Smokeless tobacco: Never  Vaping Use   Vaping Use: Never used  Substance and Sexual Activity   Alcohol use: No   Drug use: No   Sexual activity: Yes  Other Topics Concern   Not on file  Social History Narrative   Not on file   Social Determinants of Health   Financial Resource Strain: Not on file  Food Insecurity: Not on file  Transportation Needs: Not on file  Physical Activity: Not on file  Stress: Not on file  Social Connections: Not on file    Additional Social History: as above  Allergies:  No Known Allergies  Metabolic Disorder Labs: No results found for: HGBA1C, MPG No results found for: PROLACTIN No results found for: CHOL, TRIG, HDL,  CHOLHDL, VLDL, LDLCALC Lab Results  Component Value Date   TSH 1.769 01/01/2020    Therapeutic Level Labs: No results found for: LITHIUM No results found for: CBMZ No results found for: VALPROATE  Current Medications: Current Outpatient Medications  Medication Sig Dispense Refill   alendronate (FOSAMAX) 70 MG tablet Take 70 mg by mouth once a week. Take with a full glass of water on an empty stomach.     aspirin 81 MG EC tablet aspirin 81 mg tablet,delayed release  Take 1 tablet every day by oral route.     aspirin 81 MG tablet Take 81 mg by mouth daily.     calcium citrate-vitamin D (CITRACAL+D) 315-200 MG-UNIT tablet Take 1 tablet by mouth daily.     cetirizine (ZYRTEC) 10 MG tablet Take 10 mg by mouth daily.     COENZYME Q10 PO Take 1 capsule by mouth daily.     colchicine 0.6 MG tablet Take 1 tablet (0.6 mg total) by mouth 2 (two) times daily. 30 tablet 0   cyanocobalamin 1000 MCG tablet Take by mouth.     ibuprofen (ADVIL) 600 MG tablet Take 1 tablet (600 mg total) by mouth 3 (three) times daily. 30 tablet 0    metoprolol succinate (TOPROL-XL) 25 MG 24 hr tablet Take 25 mg by mouth daily.     Multiple Vitamin (MULTIVITAMIN) capsule Take 1 capsule by mouth daily.     nitroGLYCERIN (NITROSTAT) 0.3 MG SL tablet      Omega-3 Fatty Acids (FISH OIL) 1000 MG CAPS Fish Oil     Omega-3 Fatty Acids (FISH OIL) 1200 MG CPDR Take by mouth daily.     omeprazole (PRILOSEC) 20 MG capsule Take 20 mg by mouth daily.     Semaglutide 7 MG TABS Take by mouth.     sertraline (ZOLOFT) 50 MG tablet 25 mg at night for one week, then 50 mg at night 30 tablet 1   sildenafil (VIAGRA) 100 MG tablet Take 100 mg by mouth daily as needed for erectile dysfunction.     simvastatin (ZOCOR) 80 MG tablet Take 80 mg by mouth daily.     simvastatin (ZOCOR) 80 MG tablet Take by mouth.     terbinafine (LAMISIL) 250 MG tablet Take 1 tablet by mouth once daily 30 tablet 0   traZODone (DESYREL) 50 MG tablet Take 50 mg by mouth at bedtime.     Current Facility-Administered Medications  Medication Dose Route Frequency Provider Last Rate Last Admin   lidocaine (XYLOCAINE) 2 % jelly 1 application  1 application Urethral Once Stoioff, Ronda Fairly, MD        Musculoskeletal: Strength & Muscle Tone: within normal limits Gait & Station: normal Patient leans: N/A  Psychiatric Specialty Exam: Review of Systems  Psychiatric/Behavioral:  Positive for decreased concentration, dysphoric mood and sleep disturbance. Negative for agitation, behavioral problems, confusion, hallucinations, self-injury and suicidal ideas. The patient is nervous/anxious. The patient is not hyperactive.   All other systems reviewed and are negative.  Blood pressure 134/74, pulse 69, temperature 98.1 F (36.7 C), temperature source Temporal, weight 233 lb (105.7 kg).Body mass index is 30.74 kg/m.  General Appearance: Well Groomed  Eye Contact:  Good  Speech:  Clear and Coherent  Volume:  Normal  Mood:  Depressed  Affect:  Appropriate, Congruent, and calm  Thought Process:   Coherent  Orientation:  Full (Time, Place, and Person)  Thought Content:  Logical  Suicidal Thoughts:  No  Homicidal Thoughts:  No  Memory:  Immediate;   Good  Judgement:  Good  Insight:  Good  Psychomotor Activity:  Normal  Concentration:  Concentration: Good and Attention Span: Good  Recall:  Good  Fund of Knowledge:Good  Language: Good  Akathisia:  No  Handed:  Right  AIMS (if indicated):  not done  Assets:  Communication Skills Desire for Improvement  ADL's:  Intact  Cognition: WNL  Sleep:  Good   Screenings: PHQ2-9    [facility] Office Visit from 06/27/2021 in [facility]  PHQ-2 Total Score 2  PHQ-9 Total Score 7      Flowsheet Row ED from 08/05/2020 in [facility] CATEGORY No Risk       Assessment and Plan:  Eric Hanna is a 76 y.o. year old male with a history of depression, anxiety, NSTEMI, WPW, hypertension, hyperlipidemia, acute idiopathic pericarditis, OSA on CPAP, GERD, malignant neoplasm of bladder s/p chemotherapy (in [place]), s/p stomach reduction without bypass, who is referred for depression.   1. MDD (major depressive disorder), recurrent episode, mild ([place]) He reports worsening in depressive symptoms over the past several months.  Psychosocial stressors includes conflict with his daughter, concern about politics.  Will start sertraline to target depressive symptoms.  Discussed potential GI side effect and drowsiness, interaction with metoprolol.  This medication was chosen given his cardiac history.  He will greatly benefit from CBT and also to work on effective communication with his daughter; will make referral.   # r/o cognitive impairment He reports occasional memory loss and difficulty in concentration.  Etiology is multifactorial given his active mood symptoms.  Will continue to assess.   Plan Start sertraline 25 mg daily at night for one week, then 50 mg  at night  Referral to therapy  Next appointment- 3/31 at 10:30 for 30 mins, video - on trazodone, metoprolol  The patient demonstrates the following risk factors for suicide: Chronic risk factors for suicide include: psychiatric disorder of depression and chronic pain. Acute risk factors for suicide include: family or marital conflict and unemployment. Protective factors for this patient include: positive social support, responsibility to others (children, family), coping skills, and hope for the future. Considering these factors, the overall suicide risk at this point appears to be low. Patient is appropriate for outpatient follow up.   Collaboration of Care: Other referral to therapy  Consent: Patient/Guardian gives verbal consent for treatment and assignment of benefits for services provided during this visit. Patient/Guardian expressed understanding and agreed to proceed.   [name], MD 2/21/202311:20 AM

## 2021-06-27 ENCOUNTER — Telehealth: Payer: Self-pay | Admitting: Psychiatry

## 2021-06-27 ENCOUNTER — Ambulatory Visit (INDEPENDENT_AMBULATORY_CARE_PROVIDER_SITE_OTHER): Payer: Medicare Other | Admitting: Psychiatry

## 2021-06-27 ENCOUNTER — Encounter: Payer: Self-pay | Admitting: Psychiatry

## 2021-06-27 ENCOUNTER — Other Ambulatory Visit: Payer: Self-pay

## 2021-06-27 VITALS — BP 134/74 | HR 69 | Temp 98.1°F | Wt 233.0 lb

## 2021-06-27 DIAGNOSIS — F330 Major depressive disorder, recurrent, mild: Secondary | ICD-10-CM

## 2021-06-27 MED ORDER — SERTRALINE HCL 50 MG PO TABS: ORAL_TABLET | ORAL | 1 refills | Status: DC

## 2021-06-27 NOTE — Telephone Encounter (Signed)
Noted, thanks!

## 2021-06-27 NOTE — Telephone Encounter (Signed)
Patient does not want to see therapist at this moment. Canceled appt

## 2021-06-29 ENCOUNTER — Ambulatory Visit: Payer: Self-pay | Admitting: Licensed Clinical Social Worker

## 2021-07-03 ENCOUNTER — Ambulatory Visit (INDEPENDENT_AMBULATORY_CARE_PROVIDER_SITE_OTHER): Payer: Medicare Other | Admitting: Dermatology

## 2021-07-03 ENCOUNTER — Other Ambulatory Visit: Payer: Self-pay

## 2021-07-03 ENCOUNTER — Encounter: Payer: Self-pay | Admitting: Dermatology

## 2021-07-03 DIAGNOSIS — B353 Tinea pedis: Secondary | ICD-10-CM

## 2021-07-03 DIAGNOSIS — L578 Other skin changes due to chronic exposure to nonionizing radiation: Secondary | ICD-10-CM

## 2021-07-03 DIAGNOSIS — B351 Tinea unguium: Secondary | ICD-10-CM

## 2021-07-03 MED ORDER — TERBINAFINE HCL 250 MG PO TABS: 250.0000 mg | ORAL_TABLET | Freq: Every day | ORAL | 0 refills | Status: DC

## 2021-07-03 NOTE — Progress Notes (Signed)
° °  Follow-Up Visit   Subjective  Eric Hanna is a 76 y.o. male who presents for the following: Tinea Pedis (5-6 month recheck. B/L feet. Treated with 2 months of oral Terbinafine. Patient reports improvement. Has used Ketoconazole 2% cream in the past. Did not get Jublia for toenails due to excessive cost). The patient has spots, moles and lesions to be evaluated, some may be new or changing and the patient has concerns that these could be cancer.  The following portions of the chart were reviewed this encounter and updated as appropriate:  Tobacco   Allergies   Meds   Problems   Med Hx   Surg Hx   Fam Hx      Review of Systems: No other skin or systemic complaints except as noted in HPI or Assessment and Plan.  Objective  Well appearing patient in no apparent distress; mood and affect are within normal limits.  A focused examination was performed including face, feet. Relevant physical exam findings are noted in the Assessment and Plan.  B/L Feet Minimal scale at feet today. Discoloration and thickening at toenails   Assessment & Plan  Tinea pedis with Onychomycosis of feet and toenails B/L Feet Take Terbinafine 250mg  once daily for 1 more month Chronic and persistent condition with duration or expected duration over one year. Condition is symptomatic/ bothersome to patient. Not currently at goal.  terbinafine (LAMISIL) 250 MG tablet - B/L Feet Take 1 tablet (250 mg total) by mouth daily.  Actinic Damage - chronic, secondary to cumulative UV radiation exposure/sun exposure over time - diffuse scaly erythematous macules with underlying dyspigmentation - Recommend daily broad spectrum sunscreen SPF 30+ to sun-exposed areas, reapply every 2 hours as needed.  - Recommend staying in the shade or wearing long sleeves, sun glasses (UVA+UVB protection) and wide brim hats (4-inch brim around the entire circumference of the hat). - Call for new or changing lesions.  Return for TBSE As  Scheduled and 6 months Tinea Pedis Recheck.  I, Eric Hanna, CMA, am acting as scribe for Sarina Ser, MD. Documentation: I have reviewed the above documentation for accuracy and completeness, and I agree with the above.  Sarina Ser, MD

## 2021-07-03 NOTE — Patient Instructions (Signed)
Terbinafine Counseling  Terbinafine is an anti-fungal medicine that can be applied to the skin (over the counter) or taken by mouth (prescription) to treat fungal infections. The pill version is often used to treat fungal infections of the nails or scalp. While most people do not have any side effects from taking terbinafine pills, some possible side effects of the medicine can include taste changes, headache, loss of smell, vision changes, nausea, vomiting, or diarrhea.   Rare side effects can include irritation of the liver, allergic reaction, or decrease in blood counts (which may show up as not feeling well or developing an infection). If you are concerned about any of these side effects, please stop the medicine and call your doctor, or in the case of an emergency such as feeling very unwell, seek immediate medical care.   If You Need Anything After Your Visit  If you have any questions or concerns for your doctor, please call our main line at [phone] and press option 4 to reach your doctor's medical assistant. If no one answers, please leave a voicemail as directed and we will return your call as soon as possible. Messages left after 4 pm will be answered the following business day.   You may also send us a message via MyChart. We typically respond to MyChart messages within 1-2 business days.  For prescription refills, please ask your pharmacy to contact our office. Our fax number is [phone].  If you have an urgent issue when the clinic is closed that cannot wait until the next business day, you can page your doctor at the number below.    Please note that while we do our best to be available for urgent issues outside of office hours, we are not available 24/7.   If you have an urgent issue and are unable to reach us, you may choose to seek medical care at your doctor's office, retail clinic, urgent care center, or emergency room.  If you have a medical emergency, please  immediately call 911 or go to the emergency department.  Pager Numbers  - Dr. Kowalski: [phone]  - Dr. Moye: [phone]  - Dr. Stewart: [phone]  In the event of inclement weather, please call our main line at [phone] for an update on the status of any delays or closures.  Dermatology Medication Tips: Please keep the boxes that topical medications come in in order to help keep track of the instructions about where and how to use these. Pharmacies typically print the medication instructions only on the boxes and not directly on the medication tubes.   If your medication is too expensive, please contact our office at [phone] option 4 or send us a message through MyChart.   We are unable to tell what your co-pay for medications will be in advance as this is different depending on your insurance coverage. However, we may be able to find a substitute medication at lower cost or fill out paperwork to get insurance to cover a needed medication.   If a prior authorization is required to get your medication covered by your insurance company, please allow us 1-2 business days to complete this process.  Drug prices often vary depending on where the prescription is filled and some pharmacies may offer cheaper prices.  The website www.goodrx.com contains coupons for medications through different pharmacies. The prices here do not account for what the cost may be with help from insurance (it may be cheaper with your insurance), but the website can give   you the price if you did not use any insurance.  - You can print the associated coupon and take it with your prescription to the pharmacy.  - You may also stop by our office during regular business hours and pick up a GoodRx coupon card.  - If you need your prescription sent electronically to a different pharmacy, notify our office through [facility] MyChart or by phone at [phone] option 4.     Si Usted Necesita Algo Despus  de Su Visita  Tambin puede enviarnos un mensaje a travs de MyChart. Por lo general respondemos a los mensajes de MyChart en el transcurso de 1 a 2 das hbiles.  Para renovar recetas, por favor pida a su farmacia que se ponga en contacto con nuestra oficina. Nuestro nmero de fax es el [phone].  Si tiene un asunto urgente cuando la clnica est cerrada y que no puede esperar hasta el siguiente da hbil, puede llamar/localizar a su doctor(a) al nmero que aparece a continuacin.   Por favor, tenga en cuenta que aunque hacemos todo lo posible para estar disponibles para asuntos urgentes fuera del horario de oficina, no estamos disponibles las 24 horas del da, los 7 das de la semana.   Si tiene un problema urgente y no puede comunicarse con nosotros, puede optar por buscar atencin mdica  en el consultorio de su doctor(a), en una clnica privada, en un centro de atencin urgente o en una sala de emergencias.  Si tiene una emergencia mdica, por favor llame inmediatamente al 911 o vaya a la sala de emergencias.  Nmeros de bper  - Dr. Kowalski: [phone]  - Dra. Moye: [phone]  - Dra. Stewart: [phone]  En caso de inclemencias del tiempo, por favor llame a nuestra lnea principal al [phone] para una actualizacin sobre el estado de cualquier retraso o cierre.  Consejos para la medicacin en dermatologa: Por favor, guarde las cajas en las que vienen los medicamentos de uso tpico para ayudarle a seguir las instrucciones sobre dnde y cmo usarlos. Las farmacias generalmente imprimen las instrucciones del medicamento slo en las cajas y no directamente en los tubos del medicamento.   Si su medicamento es muy caro, por favor, pngase en contacto con nuestra oficina llamando al [phone] y presione la opcin 4 o envenos un mensaje a travs de MyChart.   No podemos decirle cul ser su copago por los medicamentos por adelantado ya que esto es diferente dependiendo  de la cobertura de su seguro. Sin embargo, es posible que podamos encontrar un medicamento sustituto a menor costo o llenar un formulario para que el seguro cubra el medicamento que se considera necesario.   Si se requiere una autorizacin previa para que su compaa de seguros cubra su medicamento, por favor permtanos de 1 a 2 das hbiles para completar este proceso.  Los precios de los medicamentos varan con frecuencia dependiendo del lugar de dnde se surte la receta y alguna farmacias pueden ofrecer precios ms baratos.  El sitio web www.goodrx.com tiene cupones para medicamentos de diferentes farmacias. Los precios aqu no tienen en cuenta lo que podra costar con la ayuda del seguro (puede ser ms barato con su seguro), pero el sitio web puede darle el precio si no utiliz ningn seguro.  - Puede imprimir el cupn correspondiente y llevarlo con su receta a la farmacia.  - Tambin puede pasar por nuestra oficina durante el horario de atencin regular y recoger una tarjeta de cupones de GoodRx.  - Si   necesita que su receta se enve electrnicamente a una farmacia diferente, informe a nuestra oficina a travs de MyChart de [place] o por telfono llamando al [phone] y presione la opcin 4.  

## 2021-07-04 ENCOUNTER — Encounter: Payer: Self-pay | Admitting: Dermatology

## 2021-07-17 ENCOUNTER — Telehealth: Payer: Self-pay | Admitting: Psychiatry

## 2021-07-17 NOTE — Telephone Encounter (Signed)
Patient came in about billing issues, contacted the billing department on behalf of patient. ?

## 2021-08-02 NOTE — Progress Notes (Signed)
Virtual Visit via Video Note ? ?I connected with Eric Hanna on 08/04/21 at 10:30 AM EDT by a video enabled telemedicine application and verified that I am speaking with the correct person using two identifiers. ? ?Location: ?Patient: home ?Provider: office ?Persons participated in the visit- patient, provider  ?  ?I discussed the limitations of evaluation and management by telemedicine and the availability of in person appointments. The patient expressed understanding and agreed to proceed. ? ?  ?I discussed the assessment and treatment plan with the patient. The patient was provided an opportunity to ask questions and all were answered. The patient agreed with the plan and demonstrated an understanding of the instructions. ?  ?The patient was advised to call back or seek an in-person evaluation if the symptoms worsen or if the condition fails to improve as anticipated. ? ?I provided 20 minutes of non-face-to-face time during this encounter. ? ? ?Eric Clay, MD ? ? ? ?BH MD/PA/NP OP Progress Note ? ?08/04/2021 11:08 AM ?Eric Hanna  ?MRN:  [id number] ? ?Chief Complaint:  ?Chief Complaint  ?Patient presents with  ? Depression  ? Follow-up  ? ?HPI:  ?This is a follow-up appointment for depression.  ?He states that he has been feeling more mellow.  Although he does not think the root cause has changed about the situation with his daughter, he is trying not to focusing on it so much.  He thinks the medication is helpful and that the aspect.  He enjoys going to sporting events and meet with his daughter and his grand children.  He also has a plan to go to potluck later today.  He reports great relationship with his wife.  Although he has not had a chance to ask her how he has been doing, he has been less irritable.  He tends to be irritable when he is unable to do things such as buttoning on his color.  It takes more time compared to before.  He also has short time memory loss.  He forgets what he was trying to  do when he was distracted by his wife.  He sleeps well with trazodone.  He feels less depressed.  He denies anxiety.  He has lost weight since being on semaglutide.  He denies SI.  He rarely drinks alcohol he denies drug use.  he feels comfortable to stay on his current medication.  ? ?Functional Status ?Instrumental Activities of Daily Living (IADLs):  ?Eric Hanna is independent in the following: managing finances, medications, driving ?Requires assistance with the following:  ? ?Activities of Daily Living (ADLs):  ?Eric Hanna is independent in the following: bathing and hygiene, feeding, continence, grooming and toileting, walking  ? ? ? 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 ? ?Visit Diagnosis:  ?  ICD-10-CM   ?1. MDD (major depressive disorder), recurrent episode, mild ([place])  F33.0   ?  ?2. Neurocognitive disorder  R41.9   ?  ? ? ?Past Psychiatric History: Please see initial evaluation for full details. I have reviewed the history. No updates at this time.  ? ? ?Past Medical History:  ?Past Medical History:  ?Diagnosis Date  ?  Coronary artery disease   ? DDD (degenerative disc disease), cervical   ? Depression   ? Diabetes mellitus without complication ([place])   ? borderline; no meds; no blood sugar checks  ? Diarrhea   ? GERD (gastroesophageal reflux disease)   ? Hypertension   ? Hypertrophy of prostate   ? Impotence, organic   ? Lower urinary tract symptoms (LUTS)   ? Lumbar trigger point syndrome   ? Malignant neoplasm of bladder ([place])   ? Myocardial infarction [facility]) 2000  ? Obesity   ? OSA (obstructive sleep apnea)    ? CPAP  ? Osteoarthritis   ? Sleep apnea   ? Spondylolisthesis of cervical region   ? Testicular hypofunction   ? WPW (Wolff-Parkinson-White syndrome)   ?  ?Past Surgical History:  ?Procedure Laterality Date  ? CARDIAC CATHETERIZATION    ? CATARACT EXTRACTION W/PHACO Left 06/05/2017  ? Procedure: CATARACT EXTRACTION PHACO AND INTRAOCULAR LENS PLACEMENT ([place]) LEFT;  Surgeon: Leandrew Koyanagi, MD;  Location: [place];  Service: Ophthalmology;  Laterality: Left;  sleep apnea  ? CATARACT EXTRACTION W/PHACO Right 07/03/2017  ? Procedure: CATARACT EXTRACTION PHACO AND INTRAOCULAR LENS PLACEMENT ([place]) RIGHT;  Surgeon: Leandrew Koyanagi, MD;  Location: [place];  Service: Ophthalmology;  Laterality: Right;  sleep apnea  ? CHIN IMPLANT     ? COLONOSCOPY    ? COLONOSCOPY WITH PROPOFOL N/A 09/07/2015  ? Procedure: COLONOSCOPY WITH PROPOFOL;  Surgeon: Manya Silvas, MD;  Location: [facility] ENDOSCOPY;  Service: Endoscopy;  Laterality: N/A;  ? HAND SURGERY    ? STOMACH REDUCTION WITHOUT BYPASS    ? X-STOP IMPLANTATION    ? ? ?Family Psychiatric History: Please see initial evaluation for full details. I have reviewed the history. No updates at this time.  ?  ? ?Family History: No family history on file. ? ?Social History:  ?Social History  ? ?Socioeconomic History  ? Marital status: Married  ?  Spouse name: Eric Hanna  ? Number of children: 1  ? Years of education: Not on file  ? Highest education level: Some college, no degree  ?Occupational History  ? Not on file  ?Tobacco Use  ? Smoking status: Former  ?  Types: Cigarettes  ?  Quit date: 2000  ?  Years since quitting: 23.2  ? Smokeless tobacco: Never  ?Vaping Use  ? Vaping Use: Never used  ?Substance and Sexual Activity  ? Alcohol use: No  ? Drug use: No  ? Sexual activity: Yes  ?Other Topics Concern  ? Not on file  ?Social History Narrative  ? Not on file  ? ?Social Determinants of Health  ? ?Financial Resource Strain: Not on file  ?Food Insecurity: Not on  file  ?Transportation Needs: Not on file  ?Physical Activity: Not on file  ?Stress: Not on file  ?Social Connections: Not on file  ? ? ?Allergies: No Known Allergies ? ?Metabolic Disorder Labs: ?No results found for: HGBA1C, MPG ?No results found for: PROLACTIN ?No results found for: CHOL, TRIG, HDL, CHOLHDL, VLDL, LDLCALC ?Lab Results  ?Component Value Date  ? TSH 1.769 01/01/2020  ? ? ?Therapeutic Level Labs: ?No results found for: LITHIUM ?No results found for: VALPROATE ?No components found for:  CBMZ ? ?Current Medications: ?Current Outpatient Medications  ?Medication Sig Dispense Refill  ? alendronate (FOSAMAX) 70 MG tablet Take 70 mg by mouth once a week. Take with a full glass of water on an empty stomach.    ? aspirin 81 MG  EC tablet aspirin 81 mg tablet,delayed release ? Take 1 tablet every day by oral route.    ? aspirin 81 MG tablet Take 81 mg by mouth daily.    ? calcium citrate-vitamin D (CITRACAL+D) 315-200 MG-UNIT tablet Take 1 tablet by mouth daily.    ? cetirizine (ZYRTEC) 10 MG tablet Take 10 mg by mouth daily.    ? COENZYME Q10 PO Take 1 capsule by mouth daily.    ? colchicine 0.6 MG tablet Take 1 tablet (0.6 mg total) by mouth 2 (two) times daily. 30 tablet 0  ? cyanocobalamin 1000 MCG tablet Take by mouth.    ? ibuprofen (ADVIL) 600 MG tablet Take 1 tablet (600 mg total) by mouth 3 (three) times daily. 30 tablet 0  ? metoprolol succinate (TOPROL-XL) 25 MG 24 hr tablet Take 25 mg by mouth daily.    ? Multiple Vitamin (MULTIVITAMIN) capsule Take 1 capsule by mouth daily.    ? nitroGLYCERIN (NITROSTAT) 0.3 MG SL tablet     ? Omega-3 Fatty Acids (FISH OIL) 1000 MG CAPS Fish Oil    ? Omega-3 Fatty Acids (FISH OIL) 1200 MG CPDR Take by mouth daily.    ? omeprazole (PRILOSEC) 20 MG capsule Take 20 mg by mouth daily.    ? Semaglutide 7 MG TABS Take by mouth.    ? [START ON 08/25/2021] sertraline (ZOLOFT) 50 MG tablet Take 1 tablet (50 mg total) by mouth at bedtime. 90 tablet 0  ? sildenafil (VIAGRA) 100  MG tablet Take 100 mg by mouth daily as needed for erectile dysfunction.    ? simvastatin (ZOCOR) 80 MG tablet Take 80 mg by mouth daily.    ? simvastatin (ZOCOR) 80 MG tablet Take by mouth.    ? t

## 2021-08-04 ENCOUNTER — Telehealth (INDEPENDENT_AMBULATORY_CARE_PROVIDER_SITE_OTHER): Payer: Medicare Other | Admitting: Psychiatry

## 2021-08-04 ENCOUNTER — Encounter: Payer: Self-pay | Admitting: Psychiatry

## 2021-08-04 DIAGNOSIS — F330 Major depressive disorder, recurrent, mild: Secondary | ICD-10-CM

## 2021-08-04 DIAGNOSIS — R419 Unspecified symptoms and signs involving cognitive functions and awareness: Secondary | ICD-10-CM

## 2021-08-04 DIAGNOSIS — G4700 Insomnia, unspecified: Secondary | ICD-10-CM

## 2021-08-04 MED ORDER — SERTRALINE HCL 50 MG PO TABS: 50.0000 mg | ORAL_TABLET | Freq: Every day | ORAL | 0 refills | Status: DC

## 2021-09-27 NOTE — Progress Notes (Signed)
Virtual Visit via Video Note  I connected with Rolm Baptise on 09/29/21 at 11:00 AM EDT by a video enabled telemedicine application and verified that I am speaking with the correct person using two identifiers.  Location: Patient: home Provider: office Persons participated in the visit- patient, provider    I discussed the limitations of evaluation and management by telemedicine and the availability of in person appointments. The patient expressed understanding and agreed to proceed.   I discussed the assessment and treatment plan with the patient. The patient was provided an opportunity to ask questions and all were answered. The patient agreed with the plan and demonstrated an understanding of the instructions.   The patient was advised to call back or seek an in-person evaluation if the symptoms worsen or if the condition fails to improve as anticipated.  I provided 17 minutes of non-face-to-face time during this encounter.   [name], MD    [facility] MD/PA/NP OP Progress Note  09/29/2021 11:39 AM AGAPITO HANWAY  MRN:  [id number]  Chief Complaint:  Chief Complaint  Patient presents with   Follow-up   Depression   HPI:  This is a follow-up appointment for depression and insomnia.  He states that he has been doing well.  He enjoys coffee group, and some activities as HOA.  He spent time with his granddaughters on Mother's Day.  He goes to sports activities.  He feels more calmer about things with his daughter.  Although he feels guilty about the past, he now thinks there is nothing he can do about it.  Although he feels anxious about politics, and the things going on in the world is depressing, he denies feeling depressed.  He sleeps well with trazodone.  He has good appetite.  He denies SI.  He continues to have occasional memory issues. He feels comfortable to stay on his current medication.  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 MDD (major depressive disorder), recurrent, in partial remission ([place])  F33.41     2. Insomnia, unspecified type  G47.00     3. Neurocognitive disorder  R41.9       Past Psychiatric History: Please see initial evaluation for full details. I have reviewed the history. No updates at this time.     Past Medical History:  Past Medical History:  Diagnosis Date   Coronary artery disease    DDD (degenerative disc disease), cervical    Depression    Diabetes mellitus without complication (HCC)    borderline; no meds; no blood sugar checks   Diarrhea    GERD (gastroesophageal reflux disease)    Hypertension    Hypertrophy of prostate    Impotence, organic    Lower urinary tract symptoms (LUTS)    Lumbar  trigger point syndrome    Malignant neoplasm of bladder (HCC)    Myocardial infarction ([place]) 2000   Obesity    OSA (obstructive sleep apnea)    CPAP   Osteoarthritis    Sleep apnea    Spondylolisthesis of cervical region    Testicular hypofunction    WPW (Wolff-Parkinson-White syndrome)     Past Surgical History:  Procedure Laterality Date   CARDIAC CATHETERIZATION     CATARACT EXTRACTION W/PHACO Left 06/05/2017   Procedure: CATARACT  EXTRACTION PHACO AND INTRAOCULAR LENS PLACEMENT ([place]) LEFT;  Surgeon: Leandrew Koyanagi, MD;  Location: [place];  Service: Ophthalmology;  Laterality: Left;  sleep apnea   CATARACT EXTRACTION W/PHACO Right 07/03/2017   Procedure: CATARACT EXTRACTION PHACO AND INTRAOCULAR LENS PLACEMENT ([place]) RIGHT;  Surgeon: Leandrew Koyanagi, MD;  Location: [place];  Service: Ophthalmology;  Laterality: Right;  sleep apnea   CHIN IMPLANT      COLONOSCOPY     COLONOSCOPY WITH PROPOFOL N/A 09/07/2015   Procedure: COLONOSCOPY WITH PROPOFOL;  Surgeon: Manya Silvas, MD;  [facility];  Service: Endoscopy;  Laterality: N/A;   HAND SURGERY     STOMACH REDUCTION WITHOUT BYPASS     X-STOP IMPLANTATION      Family Psychiatric History: Please see initial evaluation for full details. I have reviewed the history. No updates at this time.     Family History: History reviewed. No pertinent family history.  Social History:  Social History   Socioeconomic History   Marital status: Married    Spouse name: donna   Number of children: 1   Years of education: Not on file   Highest education level: Some college, no degree  Occupational History   Not on file  Tobacco Use   Smoking status: Former    Types: Cigarettes    Quit date: 2000    Years since quitting: 23.4   Smokeless tobacco: Never  Vaping Use   Vaping Use: Never used  Substance and Sexual Activity   Alcohol use: No   Drug use: No   Sexual activity: Yes  Other Topics Concern   Not on file  Social History Narrative   Not on file   Social Determinants of Health   Financial Resource Strain: Not on file  Food Insecurity: Not on file  Transportation Needs: Not on file  Physical Activity: Not on file  Stress: Not on file  Social Connections: Not on file    Allergies: No Known Allergies  Metabolic Disorder Labs: No results found for: HGBA1C, MPG No results found for: PROLACTIN No results found for:  CHOL, TRIG, HDL, CHOLHDL, VLDL, LDLCALC Lab Results  Component Value Date   TSH 1.769 01/01/2020    Therapeutic Level Labs: No results found for: LITHIUM No results found for: VALPROATE No components found for:  CBMZ  Current Medications: Current Outpatient Medications  Medication Sig Dispense Refill   alendronate (FOSAMAX) 70 MG tablet Take 70 mg by mouth once a week. Take with a full glass of water on an empty stomach.     aspirin 81 MG EC tablet aspirin 81 mg tablet,delayed release  Take 1 tablet every day by oral route.     aspirin 81 MG tablet Take 81 mg by mouth daily.     calcium citrate-vitamin D (CITRACAL+D) 315-200 MG-UNIT tablet Take 1 tablet by mouth daily.     cetirizine (ZYRTEC) 10 MG tablet Take 10 mg by mouth daily.     COENZYME Q10 PO  Take 1 capsule by mouth daily.     colchicine 0.6 MG tablet Take 1 tablet (0.6 mg total) by mouth 2 (two) times daily. 30 tablet 0   cyanocobalamin 1000 MCG tablet Take by mouth.     ibuprofen (ADVIL) 600 MG tablet Take 1 tablet (600 mg total) by mouth 3 (three) times daily. 30 tablet 0   metoprolol succinate (TOPROL-XL) 25 MG 24 hr tablet Take 25 mg by mouth daily.     Multiple Vitamin (MULTIVITAMIN) capsule Take 1 capsule by mouth daily.     nitroGLYCERIN (NITROSTAT) 0.3 MG SL tablet      Omega-3 Fatty Acids (FISH OIL) 1000 MG CAPS Fish Oil     Omega-3 Fatty Acids (FISH OIL) 1200 MG CPDR Take by mouth daily.     omeprazole (PRILOSEC) 20 MG capsule Take 20 mg by mouth daily.     Semaglutide 7 MG TABS Take by mouth.     [START ON 11/24/2021] sertraline (ZOLOFT) 50 MG tablet Take 1 tablet (50 mg total) by mouth at bedtime. 90 tablet 0   sildenafil (VIAGRA) 100 MG tablet Take 100 mg by mouth daily as needed for erectile dysfunction.     simvastatin (ZOCOR) 80 MG tablet Take 80 mg by mouth daily.     simvastatin (ZOCOR) 80 MG tablet Take by mouth.     terbinafine (LAMISIL) 250 MG tablet Take 1 tablet by mouth once daily 30 tablet 0    terbinafine (LAMISIL) 250 MG tablet Take 1 tablet (250 mg total) by mouth daily. 30 tablet 0   traZODone (DESYREL) 50 MG tablet Take 50 mg by mouth at bedtime.     Current Facility-Administered Medications  Medication Dose Route Frequency Provider Last Rate Last Admin   lidocaine (XYLOCAINE) 2 % jelly 1 application  1 application. Urethral Once Stoioff, Ronda Fairly, MD         Musculoskeletal: Strength & Muscle Tone:  N/A Gait & Station:  N/A Patient leans: N/A  Psychiatric Specialty Exam: Review of Systems  Psychiatric/Behavioral:  Positive for decreased concentration. Negative for agitation, behavioral problems, confusion, dysphoric mood, hallucinations, self-injury, sleep disturbance and suicidal ideas. The patient is not nervous/anxious and is not hyperactive.   All other systems reviewed and are negative.  There were no vitals taken for this visit.There is no height or weight on file to calculate BMI.  General Appearance: Fairly Groomed  Eye Contact:  Good  Speech:  Clear and Coherent  Volume:  Normal  Mood:   good  Affect:  Appropriate, Congruent, and calm  Thought Process:  Coherent  Orientation:  Full (Time, Place, and Person)  Thought Content: Logical   Suicidal Thoughts:  No  Homicidal Thoughts:  No  Memory:  Immediate;   Good  Judgement:  Good  Insight:  Good  Psychomotor Activity:  Normal  Concentration:  Concentration: Good and Attention Span: Good  Recall:  Good  Fund of Knowledge: Good  Language: Good  Akathisia:  No  Handed:  Right  AIMS (if indicated): not done  Assets:  Communication Skills Desire for Improvement  ADL's:  Intact  Cognition: WNL  Sleep:  Good   Screenings: PHQ2-9    [facility] Office Visit from 06/27/2021 in [place]  PHQ-2 Total Score 2  PHQ-9 Total Score 7      [place] ED from 08/05/2020 in [place] No Risk         Assessment  and Plan:  AHMANI PREHN is a 76 y.o. year old male with a history of depression, anxiety, NSTEMI, WPW, hypertension, hyperlipidemia, acute idiopathic pericarditis, OSA on CPAP, GERD, malignant neoplasm of bladder s/p chemotherapy (in remission), s/p stomach reduction without bypass, who presents for follow up appointment for below.   1. MDD (major depressive disorder), recurrent, in partial remission ([place]) There has been a steady improvement in irritability, and he denies any significant mood symptoms since the last visit. Psychosocial stressors includes conflict with his daughter, concern about politics.  Will continue sertraline as maintenance treatment for depression.   2. Insomnia, unspecified type Improving will continue current dose of trazodone as needed for insomnia  3. Neurocognitive disorder Unchanged. He reports occasional difficulty in the visuoconstructional activity, and short-term memory loss.  IADL is preserved otherwise. TSH, folate, Vitamin B12 in normal range.  Will consider further evaluation if any worsening.  He is encouraged for regular exercise and healthy diet.   Plan Continue sertraline 50 mg at night  Continue Trazodone 50 mg at night as needed for insomnia- he declined refill Next appointment- 8/18 at 11:30 for 30 mins, video - on trazodone, metoprolol - on semaglutide (RYBELSUS) 7 mg tablet   The patient demonstrates the following risk factors for suicide: Chronic risk factors for suicide include: psychiatric disorder of depression and chronic pain. Acute risk factors for suicide include: family or marital conflict and unemployment. Protective factors for this patient include: positive social support, responsibility to others (children, family), coping skills, and hope for the future. Considering these factors, the overall suicide risk at this point appears to be low. Patient is appropriate for outpatient follow up.        Collaboration of  Care: Collaboration of Care: Other N/A  Patient/Guardian was advised Release of Information must be obtained prior to any record release in order to collaborate their care with an outside provider. Patient/Guardian was advised if they have not already done so to contact the registration department to sign all necessary forms in order for [place] to release information regarding their care.   Consent: Patient/Guardian gives verbal consent for treatment and assignment of benefits for services provided during this visit. Patient/Guardian expressed understanding and agreed to proceed.    [name], MD 09/29/2021, 11:39 AM

## 2021-09-29 ENCOUNTER — Encounter: Payer: Self-pay | Admitting: Psychiatry

## 2021-09-29 ENCOUNTER — Telehealth (INDEPENDENT_AMBULATORY_CARE_PROVIDER_SITE_OTHER): Payer: Medicare Other | Admitting: Psychiatry

## 2021-09-29 DIAGNOSIS — F3341 Major depressive disorder, recurrent, in partial remission: Secondary | ICD-10-CM

## 2021-09-29 DIAGNOSIS — R419 Unspecified symptoms and signs involving cognitive functions and awareness: Secondary | ICD-10-CM

## 2021-09-29 DIAGNOSIS — G4700 Insomnia, unspecified: Secondary | ICD-10-CM | POA: Diagnosis not present

## 2021-09-29 MED ORDER — SERTRALINE HCL 50 MG PO TABS: 50.0000 mg | ORAL_TABLET | Freq: Every day | ORAL | 0 refills | Status: DC

## 2021-09-29 NOTE — Patient Instructions (Signed)
Continue sertraline 50 mg at night  Continue Trazodone 50 mg at night as needed for insomnia  Next appointment- 8/18 at 11:30

## 2021-12-20 NOTE — Progress Notes (Signed)
Virtual Visit via Video Note  I connected with Eric Hanna on 12/22/21 at 11:30 AM EDT by a video enabled telemedicine application and verified that I am speaking with the correct person using two identifiers.  Location: Patient: home Provider: office Persons participated in the visit- patient, provider    I discussed the limitations of evaluation and management by telemedicine and the availability of in person appointments. The patient expressed understanding and agreed to proceed.    I discussed the assessment and treatment plan with the patient. The patient was provided an opportunity to ask questions and all were answered. The patient agreed with the plan and demonstrated an understanding of the instructions.   The patient was advised to call back or seek an in-person evaluation if the symptoms worsen or if the condition fails to improve as anticipated.  I provided 11 minutes of non-face-to-face time during this encounter.   [name], MD    [facility] MD/PA/NP OP Progress Note  12/22/2021 11:58 AM Eric Hanna  MRN:  [id number]  Chief Complaint:  Chief Complaint  Patient presents with   Follow-up   Depression   HPI:  This is a follow-up appointment for depression and insomnia.  He states that he has been doing very well.  He has lost 30 pounds since starting semaglutide.  He has been able to be more mobile, and feels better.  He is doing chair of community where he lives in.  He enjoys going to watch volleyball game for his granddaughter.  He thinks they have passed on the tension they used to have in the past.  He goes to the mailbox regularly, and does the yard work.  Although it used to be a chore to him, it is as if he goes to a kitchen now.  He sleeps well most of the night.  He denies feeling depressed or anxiety.  He denies panic attacks.  He denies SI.  Although he continues to have occasional memory loss, he is able to recall things after taking some time. He has  been doing very well, and he feels comfortable to stay on the current medication regimen at this time.    Functional Status Instrumental Activities of Daily Living (IADLs):  Eric Hanna is independent in the following: managing finances, medications, driving Requires assistance with the following:   Activities of Daily Living (ADLs):  Eric Hanna is independent in the following: bathing and hygiene, feeding, continence, grooming and toileting, walking        Daily routine: gets coffee with his friends from high school Diet:  Exercise:takes a walk Support: wife Household: wife Marital status: married for 50 years Number of children: 1 daughter, age 11 Employment: was in Nature conservation officer. air Chiropractor in [phone], retired in 2012, TEFL teacher Education:  graduated from [employer] PCP / ongoing medical evaluation:  seen by cardiology, neurology  Visit Diagnosis:    ICD-10-CM   1. MDD (major depressive disorder), recurrent, in partial remission ([place])  F33.41     2. Insomnia, unspecified type  G47.00     3. Neurocognitive disorder  R41.9       Past Psychiatric History: Please see initial evaluation for full details. I have reviewed the history. No updates at this time.     Past Medical History:  Past Medical History:  Diagnosis Date   Coronary artery disease    DDD (degenerative disc disease), cervical    Depression    Diabetes mellitus  without complication (HCC)    borderline; no meds; no blood sugar checks   Diarrhea    GERD (gastroesophageal reflux disease)    Hypertension    Hypertrophy of prostate    Impotence, organic    Lower urinary tract symptoms (LUTS)    Lumbar trigger point syndrome    Malignant neoplasm of bladder (HCC)    Myocardial infarction ([place]) 2000   Obesity    OSA (obstructive sleep apnea)    CPAP   Osteoarthritis    Sleep apnea    Spondylolisthesis of cervical region    Testicular hypofunction    WPW  (Wolff-Parkinson-White syndrome)     Past Surgical History:  Procedure Laterality Date   CARDIAC CATHETERIZATION     CATARACT EXTRACTION W/PHACO Left 06/05/2017   Procedure: CATARACT EXTRACTION PHACO AND INTRAOCULAR LENS PLACEMENT ([place]) LEFT;  Surgeon: Leandrew Koyanagi, MD;  Location: [facility];  Service: Ophthalmology;  Laterality: Left;  sleep apnea   CATARACT EXTRACTION W/PHACO Right 07/03/2017   Procedure: CATARACT EXTRACTION PHACO AND INTRAOCULAR LENS PLACEMENT ([place]) RIGHT;  Surgeon: Leandrew Koyanagi, MD;  Location: [place];  Service: Ophthalmology;  Laterality: Right;  sleep apnea   CHIN IMPLANT      COLONOSCOPY     COLONOSCOPY WITH PROPOFOL N/A 09/07/2015   Procedure: COLONOSCOPY WITH PROPOFOL;  Surgeon: Manya Silvas, MD;  Location[facility] ENDOSCOPY;  Service: Endoscopy;  Laterality: N/A;   HAND SURGERY     STOMACH REDUCTION WITHOUT BYPASS     X-STOP IMPLANTATION      Family Psychiatric History: Please see initial evaluation for full details. I have reviewed the history. No updates at this time.     Family History: No family history on file.  Social History:  Social History   Socioeconomic History   Marital status: Married    Spouse name: donna   Number of children: 1   Years of education: Not on file   Highest education level: Some college, no degree  Occupational History   Not on file  Tobacco Use   Smoking status: Former    Types: Cigarettes    Quit date: 2000    Years since quitting: 23.6   Smokeless tobacco: Never  Vaping Use   Vaping Use: Never used  Substance and Sexual Activity   Alcohol use: No   Drug use: No   Sexual activity: Yes  Other Topics Concern   Not on file  Social History Narrative   Not on file   Social Determinants of Health   Financial Resource Strain: Not on file  Food Insecurity: Not on file  Transportation Needs: Not on file  Physical Activity: Not on file  Stress: Not on file  Social Connections:  Not on file    Allergies: No Known Allergies  Metabolic Disorder Labs: No results found for: "HGBA1C", "MPG" No results found for: "PROLACTIN" No results found for: "CHOL", "TRIG", "HDL", "CHOLHDL", "VLDL", "LDLCALC" Lab Results  Component Value Date   TSH 1.769 01/01/2020    Therapeutic Level Labs: No results found for: "LITHIUM" No results found for: "VALPROATE" No results found for: "CBMZ"  Current Medications: Current Outpatient Medications  Medication Sig Dispense Refill   alendronate (FOSAMAX) 70 MG tablet Take 70 mg by mouth once a week. Take with a full glass of water on an empty stomach.     aspirin 81 MG EC tablet aspirin 81 mg tablet,delayed release  Take 1 tablet every day by oral route.     aspirin  81 MG tablet Take 81 mg by mouth daily.     calcium citrate-vitamin D (CITRACAL+D) 315-200 MG-UNIT tablet Take 1 tablet by mouth daily.     cetirizine (ZYRTEC) 10 MG tablet Take 10 mg by mouth daily.     COENZYME Q10 PO Take 1 capsule by mouth daily.     colchicine 0.6 MG tablet Take 1 tablet (0.6 mg total) by mouth 2 (two) times daily. 30 tablet 0   cyanocobalamin 1000 MCG tablet Take by mouth.     ibuprofen (ADVIL) 600 MG tablet Take 1 tablet (600 mg total) by mouth 3 (three) times daily. 30 tablet 0   metoprolol succinate (TOPROL-XL) 25 MG 24 hr tablet Take 25 mg by mouth daily.     Multiple Vitamin (MULTIVITAMIN) capsule Take 1 capsule by mouth daily.     nitroGLYCERIN (NITROSTAT) 0.3 MG SL tablet      Omega-3 Fatty Acids (FISH OIL) 1000 MG CAPS Fish Oil     Omega-3 Fatty Acids (FISH OIL) 1200 MG CPDR Take by mouth daily.     omeprazole (PRILOSEC) 20 MG capsule Take 20 mg by mouth daily.     Semaglutide 7 MG TABS Take by mouth.     [START ON 02/23/2022] sertraline (ZOLOFT) 50 MG tablet Take 1 tablet (50 mg total) by mouth at bedtime. 90 tablet 0   sildenafil (VIAGRA) 100 MG tablet Take 100 mg by mouth daily as needed for erectile dysfunction.     simvastatin  (ZOCOR) 80 MG tablet Take 80 mg by mouth daily.     simvastatin (ZOCOR) 80 MG tablet Take by mouth.     terbinafine (LAMISIL) 250 MG tablet Take 1 tablet by mouth once daily 30 tablet 0   terbinafine (LAMISIL) 250 MG tablet Take 1 tablet (250 mg total) by mouth daily. 30 tablet 0   traZODone (DESYREL) 50 MG tablet Take 50 mg by mouth at bedtime.     Current Facility-Administered Medications  Medication Dose Route Frequency Provider Last Rate Last Admin   lidocaine (XYLOCAINE) 2 % jelly 1 application  1 application  Urethral Once Stoioff, Ronda Fairly, MD         Musculoskeletal: Strength & Muscle Tone:  N/A Gait & Station:  N/A Patient leans: N/A  Psychiatric Specialty Exam: Review of Systems  Psychiatric/Behavioral:  Negative for agitation, behavioral problems, confusion, decreased concentration, dysphoric mood, hallucinations, self-injury, sleep disturbance and suicidal ideas. The patient is not nervous/anxious and is not hyperactive.   All other systems reviewed and are negative.   There were no vitals taken for this visit.There is no height or weight on file to calculate BMI.  General Appearance: Fairly Groomed  Eye Contact:  Good  Speech:  Clear and Coherent  Volume:  Normal  Mood:   good  Affect:  Appropriate, Congruent, and Full Range  Thought Process:  Coherent  Orientation:  Full (Time, Place, and Person)  Thought Content: Logical   Suicidal Thoughts:  No  Homicidal Thoughts:  No  Memory:  Immediate;   Good  Judgement:  Good  Insight:  Good  Psychomotor Activity:  Normal  Concentration:  Concentration: Good and Attention Span: Good  Recall:  Good  Fund of Knowledge: Good  Language: Good  Akathisia:  No  Handed:  Right  AIMS (if indicated): not done  Assets:  Communication Skills Desire for Improvement  ADL's:  Intact  Cognition: WNL  Sleep:  Good   Screenings: [facility] Rx Row Office Visit  from 06/27/2021 in [place]   PHQ-2 Total Score 2  PHQ-9 Total Score 7      Flowsheet Row ED from 08/05/2020 in [place] CATEGORY No Risk        Assessment and Plan:  Eric Hanna is a 76 y.o. year old male with a history of  depression, anxiety, NSTEMI, WPW, hypertension, hyperlipidemia, acute idiopathic pericarditis, OSA on CPAP, GERD, malignant neoplasm of bladder s/p chemotherapy (in remission), s/p stomach reduction without bypass, who presents for follow up appointment for below.   1. MDD (major depressive disorder), recurrent, in full remission ([place]) There has been steady improvement in his mood symptoms since starting sertraline.  Psychosocial stressors includes concern about current politics. He would like to be followed by PCP moving forward.  He had at least 1 episode of depression a few years ago.  He was advised/and has agreed to stay on sertraline at the current dose as maintenance treatment at least for 9 months to 1 year; at least until the end of this year.    2. Insomnia, unspecified type Improving.  Will continue current dose of trazodone as needed for insomnia.   3. Neurocognitive disorder Unchanged. He reports occasional difficulty in the visuoconstructional activity, and short-term memory loss.  IADL is preserved otherwise. TSH, folate, Vitamin B12 in normal range.  Will consider further evaluation if any worsening.  He is encouraged for regular exercise and healthy diet.     Plan Continue sertraline 50 mg at night  Continue Trazodone 50 mg at night as needed for insomnia- he declined refill Next appointment- as needed. He will be followed by his PCP - on trazodone, metoprolol - on semaglutide (RYBELSUS) 7 mg tablet   The patient demonstrates the following risk factors for suicide: Chronic risk factors for suicide include: psychiatric disorder of depression and chronic pain. Acute risk factors for suicide include: family or marital  conflict and unemployment. Protective factors for this patient include: positive social support, responsibility to others (children, family), coping skills, and hope for the future. Considering these factors, the overall suicide risk at this point appears to be low. Patient is appropriate for outpatient follow up.           Collaboration of Care: Collaboration of Care: Other N/A  Patient/Guardian was advised Release of Information must be obtained prior to any record release in order to collaborate their care with an outside provider. Patient/Guardian was advised if they have not already done so to contact the registration department to sign all necessary forms in order for [place] to release information regarding their care.   Consent: Patient/Guardian gives verbal consent for treatment and assignment of benefits for services provided during this visit. Patient/Guardian expressed understanding and agreed to proceed.    [name], MD 12/22/2021, 11:58 AM

## 2021-12-22 ENCOUNTER — Telehealth (INDEPENDENT_AMBULATORY_CARE_PROVIDER_SITE_OTHER): Payer: Medicare Other | Admitting: Psychiatry

## 2021-12-22 ENCOUNTER — Encounter: Payer: Self-pay | Admitting: Psychiatry

## 2021-12-22 DIAGNOSIS — G4700 Insomnia, unspecified: Secondary | ICD-10-CM

## 2021-12-22 DIAGNOSIS — F3341 Major depressive disorder, recurrent, in partial remission: Secondary | ICD-10-CM | POA: Diagnosis not present

## 2021-12-22 DIAGNOSIS — R419 Unspecified symptoms and signs involving cognitive functions and awareness: Secondary | ICD-10-CM

## 2021-12-22 MED ORDER — SERTRALINE HCL 50 MG PO TABS: 50.0000 mg | ORAL_TABLET | Freq: Every day | ORAL | 0 refills | Status: AC

## 2022-01-16 ENCOUNTER — Other Ambulatory Visit: Payer: Self-pay | Admitting: Internal Medicine

## 2022-01-16 DIAGNOSIS — I7121 Aneurysm of the ascending aorta, without rupture: Secondary | ICD-10-CM

## 2022-01-16 DIAGNOSIS — E1122 Type 2 diabetes mellitus with diabetic chronic kidney disease: Secondary | ICD-10-CM

## 2022-01-25 ENCOUNTER — Ambulatory Visit
Admission: RE | Admit: 2022-01-25 | Discharge: 2022-01-25 | Disposition: A | Discharge status: Home / Self Care | Payer: Medicare Other | Source: Ambulatory Visit | Attending: Internal Medicine | Admitting: Internal Medicine

## 2022-01-25 DIAGNOSIS — I7121 Aneurysm of the ascending aorta, without rupture: Secondary | ICD-10-CM

## 2022-01-25 DIAGNOSIS — E1122 Type 2 diabetes mellitus with diabetic chronic kidney disease: Secondary | ICD-10-CM

## 2022-01-25 MED ORDER — IOPAMIDOL (ISOVUE-300) INJECTION 61%
75.0000 mL | Freq: Once | INTRAVENOUS | Status: AC | PRN
  Administered 2022-01-25: 75 mL via INTRAVENOUS

## 2022-02-01 ENCOUNTER — Ambulatory Visit: Payer: Medicare Other | Admitting: Dermatology

## 2022-04-16 ENCOUNTER — Ambulatory Visit (INDEPENDENT_AMBULATORY_CARE_PROVIDER_SITE_OTHER): Payer: Medicare Other | Admitting: Dermatology

## 2022-04-16 DIAGNOSIS — D229 Melanocytic nevi, unspecified: Secondary | ICD-10-CM

## 2022-04-16 DIAGNOSIS — L814 Other melanin hyperpigmentation: Secondary | ICD-10-CM

## 2022-04-16 DIAGNOSIS — L578 Other skin changes due to chronic exposure to nonionizing radiation: Secondary | ICD-10-CM | POA: Diagnosis not present

## 2022-04-16 DIAGNOSIS — D489 Neoplasm of uncertain behavior, unspecified: Secondary | ICD-10-CM

## 2022-04-16 DIAGNOSIS — L820 Inflamed seborrheic keratosis: Secondary | ICD-10-CM

## 2022-04-16 DIAGNOSIS — C4491 Basal cell carcinoma of skin, unspecified: Secondary | ICD-10-CM

## 2022-04-16 DIAGNOSIS — Z1283 Encounter for screening for malignant neoplasm of skin: Secondary | ICD-10-CM | POA: Diagnosis not present

## 2022-04-16 DIAGNOSIS — C4441 Basal cell carcinoma of skin of scalp and neck: Secondary | ICD-10-CM

## 2022-04-16 DIAGNOSIS — L719 Rosacea, unspecified: Secondary | ICD-10-CM

## 2022-04-16 DIAGNOSIS — Z79899 Other long term (current) drug therapy: Secondary | ICD-10-CM

## 2022-04-16 DIAGNOSIS — B353 Tinea pedis: Secondary | ICD-10-CM

## 2022-04-16 DIAGNOSIS — L821 Other seborrheic keratosis: Secondary | ICD-10-CM

## 2022-04-16 HISTORY — DX: Basal cell carcinoma of skin, unspecified: C44.91

## 2022-04-16 NOTE — Progress Notes (Signed)
Follow-Up Visit   Subjective  Eric Hanna is a 76 y.o. male who presents for the following: Annual Exam (Tbse, hx of rosacea, hx of tinea pedis, reports a spot at left side of neck ). The patient presents for Total-Body Skin Exam (TBSE) for skin cancer screening and mole check.  The patient has spots, moles and lesions to be evaluated, some may be new or changing and the patient has concerns that these could be cancer.  The following portions of the chart were reviewed this encounter and updated as appropriate:  Tobacco  Allergies  Meds  Problems  Med Hx  Surg Hx  Fam Hx     Review of Systems: No other skin or systemic complaints except as noted in HPI or Assessment and Plan.  Objective  Well appearing patient in no apparent distress; mood and affect are within normal limits.  A full examination was performed including scalp, head, eyes, ears, nose, lips, neck, chest, axillae, abdomen, back, buttocks, bilateral upper extremities, bilateral lower extremities, hands, feet, fingers, toes, fingernails, and toenails. All findings within normal limits unless otherwise noted below.  face Mild pinkness at mid face   left neck 1.2 cm crusted papule      Left Ear x 1 Erythematous stuck-on, waxy papule or plaque   Assessment & Plan  Rosacea face Rosacea is a chronic progressive skin condition usually affecting the face of adults, causing redness and/or acne bumps. It is treatable but not curable. It sometimes affects the eyes (ocular rosacea) as well. It may respond to topical and/or systemic medication and can flare with stress, sun exposure, alcohol, exercise, topical steroids (including hydrocortisone/cortisone 10) and some foods.  Daily application of broad spectrum spf 30+ sunscreen to face is recommended to reduce flares.  Discussed the treatment option of BBL/laser.  Typically we recommend 1-3 treatment sessions about 5-8 weeks apart for best results.  The patient's  condition may require "maintenance treatments" in the future.  The fee for BBL / laser treatments is $350 per treatment session for the whole face.  A fee can be quoted for other parts of the body. Insurance typically does not pay for BBL/laser treatments and therefore the fee is an out-of-pocket cost.  Tinea pedis of both feet b/l feet and toenails Tinea pedis with Onychomycosis of feet and toenails Chronic and persistent condition with duration or expected duration over one year. Condition is symptomatic/ bothersome to patient.  Improved.  Patient will call if he would like to try treatment for toenails.   Related Medications terbinafine (LAMISIL) 250 MG tablet Take 1 tablet by mouth once daily  Neoplasm of uncertain behavior left neck Skin / nail biopsy Type of biopsy: tangential   Informed consent: discussed and consent obtained   Timeout: patient name, date of birth, surgical site, and procedure verified   Procedure prep:  Patient was prepped and draped in usual sterile fashion Prep type:  Isopropyl alcohol Anesthesia: the lesion was anesthetized in a standard fashion   Anesthetic:  1% lidocaine w/ epinephrine 1-100,000 buffered w/ 8.4% NaHCO3 Instrument used: flexible razor blade   Hemostasis achieved with: pressure, aluminum chloride and electrodesiccation   Outcome: patient tolerated procedure well   Post-procedure details: sterile dressing applied and wound care instructions given   Dressing type: petrolatum and bandage    Specimen 1 - Surgical pathology Differential Diagnosis: R/o BCC Check Margins: No R/o bcc  Inflamed seborrheic keratosis Left Ear x 1 Symptomatic, irritating, patient would like treated. Destruction  of lesion - Left Ear x 1 Complexity: simple   Destruction method: cryotherapy   Informed consent: discussed and consent obtained   Timeout:  patient name, date of birth, surgical site, and procedure verified Lesion destroyed using liquid nitrogen: Yes    Region frozen until ice ball extended beyond lesion: Yes   Outcome: patient tolerated procedure well with no complications   Post-procedure details: wound care instructions given    Lentigines - Scattered tan macules - Due to sun exposure - Benign-appearing, observe - Recommend daily broad spectrum sunscreen SPF 30+ to sun-exposed areas, reapply every 2 hours as needed. - Call for any changes  Seborrheic Keratoses - Stuck-on, waxy, tan-brown papules and/or plaques  - Benign-appearing - Discussed benign etiology and prognosis. - Observe - Call for any changes  Melanocytic Nevi - Tan-brown and/or pink-flesh-colored symmetric macules and papules - Benign appearing on exam today - Observation - Call clinic for new or changing moles - Recommend daily use of broad spectrum spf 30+ sunscreen to sun-exposed areas.   Hemangiomas - Red papules - Discussed benign nature - Observe - Call for any changes  Actinic Damage - Chronic condition, secondary to cumulative UV/sun exposure - diffuse scaly erythematous macules with underlying dyspigmentation - Recommend daily broad spectrum sunscreen SPF 30+ to sun-exposed areas, reapply every 2 hours as needed.  - Staying in the shade or wearing long sleeves, sun glasses (UVA+UVB protection) and wide brim hats (4-inch brim around the entire circumference of the hat) are also recommended for sun protection.  - Call for new or changing lesions.  Skin cancer screening performed today. No follow-ups on file. IRuthell Rummage, CMA, am acting as scribe for Sarina Ser, MD. Documentation: I have reviewed the above documentation for accuracy and completeness, and I agree with the above.  Sarina Ser, MD

## 2022-04-16 NOTE — Patient Instructions (Addendum)
Biopsy Wound Care Instructions  Leave the original bandage on for 24 hours if possible.  If the bandage becomes soaked or soiled before that time, it is OK to remove it and examine the wound.  A small amount of post-operative bleeding is normal.  If excessive bleeding occurs, remove the bandage, place gauze over the site and apply continuous pressure (no peeking) over the area for 30 minutes. If this does not work, please call our clinic as soon as possible or page your doctor if it is after hours.   Once a day, cleanse the wound with soap and water. It is fine to shower. If a thick crust develops you may use a Q-tip dipped into dilute hydrogen peroxide (mix 1:1 with water) to dissolve it.  Hydrogen peroxide can slow the healing process, so use it only as needed.    After washing, apply petroleum jelly (Vaseline) or an antibiotic ointment if your doctor prescribed one for you, followed by a bandage.    For best healing, the wound should be covered with a layer of ointment at all times. If you are not able to keep the area covered with a bandage to hold the ointment in place, this may mean re-applying the ointment several times a day.  Continue this wound care until the wound has healed and is no longer open.   Itching and mild discomfort is normal during the healing process. However, if you develop pain or severe itching, please call our office.   If you have any discomfort, you can take Tylenol (acetaminophen) or ibuprofen as directed on the bottle. (Please do not take these if you have an allergy to them or cannot take them for another reason).  Some redness, tenderness and white or yellow material in the wound is normal healing.  If the area becomes very sore and red, or develops a thick yellow-green material (pus), it may be infected; please notify us.    If you have stitches, return to clinic as directed to have the stitches removed. You will continue wound care for 2-3 days after the stitches  are removed.   Wound healing continues for up to one year following surgery. It is not unusual to experience pain in the scar from time to time during the interval.  If the pain becomes severe or the scar thickens, you should notify the office.    A slight amount of redness in a scar is expected for the first six months.  After six months, the redness will fade and the scar will soften and fade.  The color difference becomes less noticeable with time.  If there are any problems, return for a post-op surgery check at your earliest convenience.  To improve the appearance of the scar, you can use silicone scar gel, cream, or sheets (such as Mederma or Serica) every night for up to one year. These are available over the counter (without a prescription).  Please call our office at [phone] for any questions or concerns.  Cryotherapy Aftercare  Wash gently with soap and water everyday.   Apply Vaseline and Band-Aid daily until healed.   Seborrheic Keratosis  What causes seborrheic keratoses? Seborrheic keratoses are harmless, common skin growths that first appear during adult life.  As time goes by, more growths appear.  Some people may develop a large number of them.  Seborrheic keratoses appear on both covered and uncovered body parts.  They are not caused by sunlight.  The tendency to develop seborrheic keratoses  can be inherited.  They vary in color from skin-colored to gray, brown, or even black.  They can be either smooth or have a rough, warty surface.   Seborrheic keratoses are superficial and look as if they were stuck on the skin.  Under the microscope this type of keratosis looks like layers upon layers of skin.  That is why at times the top layer may seem to fall off, but the rest of the growth remains and re-grows.    Treatment Seborrheic keratoses do not need to be treated, but can easily be removed in the office.  Seborrheic keratoses often cause symptoms when they rub on clothing  or jewelry.  Lesions can be in the way of shaving.  If they become inflamed, they can cause itching, soreness, or burning.  Removal of a seborrheic keratosis can be accomplished by freezing, burning, or surgery. If any spot bleeds, scabs, or grows rapidly, please return to have it checked, as these can be an indication of a skin cancer.    Melanoma ABCDEs  Melanoma is the most dangerous type of skin cancer, and is the leading cause of death from skin disease.  You are more likely to develop melanoma if you: Have light-colored skin, light-colored eyes, or red or blond hair Spend a lot of time in the sun Tan regularly, either outdoors or in a tanning bed Have had blistering sunburns, especially during childhood Have a close family member who has had a melanoma Have atypical moles or large birthmarks  Early detection of melanoma is key since treatment is typically straightforward and cure rates are extremely high if we catch it early.   The first sign of melanoma is often a change in a mole or a new dark spot.  The ABCDE system is a way of remembering the signs of melanoma.  A for asymmetry:  The two halves do not match. B for border:  The edges of the growth are irregular. C for color:  A mixture of colors are present instead of an even brown color. D for diameter:  Melanomas are usually (but not always) greater than 30m - the size of a pencil eraser. E for evolution:  The spot keeps changing in size, shape, and color.  Please check your skin once per month between visits. You can use a small mirror in front and a large mirror behind you to keep an eye on the back side or your body.   If you see any new or changing lesions before your next follow-up, please call to schedule a visit.  Please continue daily skin protection including broad spectrum sunscreen SPF 30+ to sun-exposed areas, reapplying every 2 hours as needed when you're outdoors.   Staying in the shade or wearing long sleeves,  sun glasses (UVA+UVB protection) and wide brim hats (4-inch brim around the entire circumference of the hat) are also recommended for sun protection.    Due to recent changes in healthcare laws, you may see results of your pathology and/or laboratory studies on MyChart before the doctors have had a chance to review them. We understand that in some cases there may be results that are confusing or concerning to you. Please understand that not all results are received at the same time and often the doctors may need to interpret multiple results in order to provide you with the best plan of care or course of treatment. Therefore, we ask that you please give u[place]2 business days to thoroughly review all  your results before contacting the office for clarification. Should we see a critical lab result, you will be contacted sooner.   If You Need Anything After Your Visit  If you have any questions or concerns for your doctor, please call our main line at [phone] and press option 4 to reach your doctor's medical assistant. If no one answers, please leave a voicemail as directed and we will return your call as soon as possible. Messages left after 4 pm will be answered the following business day.   You may also send [place] a message via [place]. We typically respond to MyChart messages within 1-2 business days.  For prescription refills, please ask your pharmacy to contact our office. Our fax number is [phone].  If you have an urgent issue when the clinic is closed that cannot wait until the next business day, you can page your doctor at the number below.    Please note that while we do our best to be available for urgent issues outside of office hours, we are not available 24/7.   If you have an urgent issue and are unable to reach [place], you may choose to seek medical care at your doctor's office, retail clinic, urgent care center, or emergency room.  If you have a medical emergency, please immediately  call 911 or go to the emergency department.  Pager Numbers  - Dr. Nehemiah Massed: [phone]  - Dr. Laurence Ferrari: [phone]  - Dr. Nicole Kindred: [phone]  In the event of inclement weather, please call our main line at [phone] for an update on the status of any delays or closures.  Dermatology Medication Tips: Please keep the boxes that topical medications come in in order to help keep track of the instructions about where and how to use these. Pharmacies typically print the medication instructions only on the boxes and not directly on the medication tubes.   If your medication is too expensive, please contact our office at [phone] option 4 or send [place] a message through [place].   We are unable to tell what your co-pay for medications will be in advance as this is different depending on your insurance coverage. However, we may be able to find a substitute medication at lower cost or fill out paperwork to get insurance to cover a needed medication.   If a prior authorization is required to get your medication covered by your insurance company, please allow [place] 1-2 business days to complete this process.  Drug prices often vary depending on where the prescription is filled and some pharmacies may offer cheaper prices.  The website www.goodrx.com contains coupons for medications through different pharmacies. The prices here do not account for what the cost may be with help from insurance (it may be cheaper with your insurance), but the website can give you the price if you did not use any insurance.  - You can print the associated coupon and take it with your prescription to the pharmacy.  - You may also stop by our office during regular business hours and pick up a GoodRx coupon card.  - If you need your prescription sent electronically to a different pharmacy, notify our office through [facility] or by phone at [phone] option 4.     Si Usted Necesita Algo Despus de Su  Visita  Tambin puede enviarnos un mensaje a travs de Pharmacist, community. Por lo general respondemos a los mensajes de MyChart en el transcurso de 1 a 2 das hbiles.  Para renovar recetas, por  favor pida a su farmacia que se ponga en contacto con nuestra oficina. Harland Dingwall de fax es [place] [phone].  Si tiene un asunto urgente cuando la clnica est cerrada y que no puede esperar hasta el siguiente da hbil, puede llamar/localizar a su doctor(a) al nmero que aparece a continuacin.   Por favor, tenga en cuenta que aunque hacemos todo lo posible para estar disponibles para asuntos urgentes fuera del horario de [place], no estamos disponibles las 24 horas del da, los 7 das de la [place].   Si tiene un problema urgente y no puede comunicarse con nosotros, puede optar por buscar atencin mdica  en el consultorio de su doctor(a), en una clnica privada, en un centro de atencin urgente o en una sala de emergencias.  Si tiene Engineering geologist, por favor llame inmediatamente al 911 o vaya a la sala de emergencias.  Nmeros de bper  - Dr. Nehemiah Massed: [phone]  - Dra. Moye: [phone]  - Dra. Nicole Kindred: [phone]  En caso de inclemencias del [place], por favor llame a Johnsie Kindred principal al [phone] para una actualizacin sobre el [place] de cualquier retraso o cierre.  Consejos para la medicacin en dermatologa: Por favor, guarde las cajas en las que vienen los medicamentos de uso tpico para ayudarle a seguir las instrucciones sobre dnde y cmo usarlos. Las farmacias generalmente imprimen las instrucciones del medicamento slo en las cajas y no directamente en los tubos del [place].   Si su medicamento es muy caro, por favor, pngase en contacto con Zigmund Daniel llamando al [phone] y presione la opcin 4 o envenos un mensaje a travs de Pharmacist, community.   No podemos decirle cul ser su copago por los medicamentos por adelantado ya que esto es diferente dependiendo de la  cobertura de su seguro. Sin embargo, es posible que podamos encontrar un medicamento sustituto a Electrical engineer un formulario para que el seguro cubra el medicamento que se considera necesario.   Si se requiere una autorizacin previa para que su compaa de seguros [place] su medicamento, por favor permtanos de 1 a 2 das hbiles para completar este proceso.  Los precios de los medicamentos varan con frecuencia dependiendo del Environmental consultant de dnde se surte la receta y alguna farmacias pueden ofrecer precios ms baratos.  El sitio web www.goodrx.com tiene cupones para medicamentos de Airline pilot. Los precios aqu no tienen en cuenta lo que podra costar con la ayuda del seguro (puede ser ms barato con su seguro), pero el sitio web puede darle el precio si no utiliz Research scientist (physical sciences).  - Puede imprimir el cupn correspondiente y llevarlo con su receta a la farmacia.  - Tambin puede pasar por nuestra oficina durante el horario de atencin regular y Charity fundraiser una tarjeta de cupones de GoodRx.  - Si necesita que su receta se enve electrnicamente a una farmacia diferente, informe a nuestra oficina a travs de MyChart de [place] o por telfono llamando al [phone] y presione la opcin 4.

## 2022-04-23 ENCOUNTER — Encounter: Payer: Self-pay | Admitting: Dermatology

## 2022-04-24 ENCOUNTER — Telehealth: Payer: Self-pay

## 2022-04-24 NOTE — Telephone Encounter (Signed)
Left voicemail to return my call

## 2022-04-24 NOTE — Telephone Encounter (Signed)
Biopsy results discussed with pt return for surgery appt scheduled for Jan 23 at 1:30

## 2022-04-24 NOTE — Telephone Encounter (Signed)
-----   Message from Ralene Bathe, MD sent at 04/23/2022  5:29 PM EST ----- Diagnosis Skin , left neck BASAL CELL CARCINOMA, NODULAR AND INFILTRATIVE PATTERNS  Cancer - BCC Schedule for surgery

## 2022-05-29 ENCOUNTER — Ambulatory Visit: Payer: Medicare Other | Admitting: Dermatology

## 2022-05-29 ENCOUNTER — Encounter: Payer: Self-pay | Admitting: Dermatology

## 2022-05-29 DIAGNOSIS — C4441 Basal cell carcinoma of skin of scalp and neck: Secondary | ICD-10-CM

## 2022-05-29 MED ORDER — MUPIROCIN 2 % EX OINT: 1.0000 | TOPICAL_OINTMENT | Freq: Every day | CUTANEOUS | 0 refills | Status: DC

## 2022-05-29 NOTE — Patient Instructions (Addendum)

## 2022-05-29 NOTE — Progress Notes (Signed)
   Follow-Up Visit   Subjective  Eric Hanna is a 77 y.o. male who presents for the following: BCC bx proven (L neck, pt presents for excision).  The following portions of the chart were reviewed this encounter and updated as appropriate:   Tobacco  Allergies  Meds  Problems  Med Hx  Surg Hx  Fam Hx     Review of Systems:  No other skin or systemic complaints except as noted in HPI or Assessment and Plan.  Objective  Well appearing patient in no apparent distress; mood and affect are within normal limits.  A focused examination was performed including neck. Relevant physical exam findings are noted in the Assessment and Plan.  L neck Pink bx site 2.0cm   Assessment & Plan  Basal cell carcinoma (BCC) of skin of neck L neck  Skin excision  Lesion length (cm):  2 Lesion width (cm):  2 Margin per side (cm):  0.2 Total excision diameter (cm):  2.4 Informed consent: discussed and consent obtained   Timeout: patient name, date of birth, surgical site, and procedure verified   Procedure prep:  Patient was prepped and draped in usual sterile fashion Prep type:  Isopropyl alcohol and povidone-iodine Anesthesia: the lesion was anesthetized in a standard fashion   Anesthetic:  1% lidocaine w/ epinephrine 1-100,000 buffered w/ 8.4% NaHCO3 (9cc lido w/ epi, 6cc bupivicaine, Total of 15cc) Instrument used: #15 blade   Instrument used comment:  #15c blade Hemostasis achieved with: pressure   Hemostasis achieved with comment:  Electrocautery Outcome: patient tolerated procedure well with no complications   Post-procedure details: sterile dressing applied and wound care instructions given   Dressing type: bandage, pressure dressing and bacitracin (Mupirocin)    Skin repair Complexity:  Complex Final length (cm):  7.5 Reason for type of repair: reduce tension to allow closure, reduce the risk of dehiscence, infection, and necrosis, reduce subcutaneous dead space and avoid a  hematoma, allow closure of the large defect, preserve normal anatomy, preserve normal anatomical and functional relationships and enhance both functionality and cosmetic results   Undermining: area extensively undermined   Undermining comment:  Undermining Defect 2.4cm Subcutaneous layers (deep stitches):  Suture size:  4-0 Suture type: Vicryl (polyglactin 910)   Subcutaneous suture technique: Inverted Dermal. Fine/surface layer approximation (top stitches):  Suture size:  4-0 Suture type: nylon   Stitches: simple running   Suture removal (days):  7 Hemostasis achieved with: pressure Outcome: patient tolerated procedure well with no complications   Post-procedure details: sterile dressing applied and wound care instructions given   Dressing type: bandage, pressure dressing and bacitracin (Mupirocin)    mupirocin ointment (BACTROBAN) 2 % Apply 1 Application topically daily. Qd to excision site  Specimen 1 - Surgical pathology Differential Diagnosis: Bx proven BCC  Check Margins: yes Pink bx site 2.0cm [id number] Suture at posterior 3 o'clock edge  Bx proven, excised today Start Mupirocin oint qd to excision site   Return in about 1 week (around 06/05/2022) for suture removal.  I, Othelia Pulling, RMA, am acting as scribe for Sarina Ser, MD .  Documentation: I have reviewed the above documentation for accuracy and completeness, and I agree with the above.  Sarina Ser, MD

## 2022-05-30 ENCOUNTER — Telehealth: Payer: Self-pay

## 2022-05-30 NOTE — Telephone Encounter (Signed)
Patient doing well after yesterday's surgery.Eric Hanna

## 2022-06-05 ENCOUNTER — Ambulatory Visit (INDEPENDENT_AMBULATORY_CARE_PROVIDER_SITE_OTHER): Payer: Medicare Other | Admitting: Dermatology

## 2022-06-05 DIAGNOSIS — Z85828 Personal history of other malignant neoplasm of skin: Secondary | ICD-10-CM

## 2022-06-05 NOTE — Progress Notes (Signed)
   Follow-Up Visit   Subjective  Eric Hanna is a 77 y.o. male who presents for the following: Post op/suture removal (Pathology proven margins free BCC of the L neck, patient here for suture removal today).  The following portions of the chart were reviewed this encounter and updated as appropriate:   Tobacco  Allergies  Meds  Problems  Med Hx  Surg Hx  Fam Hx     Review of Systems:  No other skin or systemic complaints except as noted in HPI or Assessment and Plan.  Objective  Well appearing patient in no apparent distress; mood and affect are within normal limits.  A focused examination was performed including the face and neck. Relevant physical exam findings are noted in the Assessment and Plan.  L neck Healing excision site.    Assessment & Plan  History of basal cell carcinoma L neck  Encounter for Removal of Sutures - Incision site at the L neck is clean, dry and intact - Wound cleansed, sutures removed, wound cleansed and steri strips applied.  - Discussed pathology results showing a margins free basal cell carcinoma.  - Patient advised to keep steri-strips dry until they fall off. - Scars remodel for a full year. - Once steri-strips fall off, patient can apply over-the-counter silicone scar cream each night to help with scar remodeling if desired. - Patient advised to call with any concerns or if they notice any new or changing lesions.  Return for appointment as scheduled.  Luther Redo, CMA, am acting as scribe for Sarina Ser, MD . Documentation: I have reviewed the above documentation for accuracy and completeness, and I agree with the above.  Sarina Ser, MD

## 2022-06-05 NOTE — Patient Instructions (Signed)
Due to recent changes in healthcare laws, you may see results of your pathology and/or laboratory studies on MyChart before the doctors have had a chance to review them. We understand that in some cases there may be results that are confusing or concerning to you. Please understand that not all results are received at the same time and often the doctors may need to interpret multiple results in order to provide you with the best plan of care or course of treatment. Therefore, we ask that you please give us 2 business days to thoroughly review all your results before contacting the office for clarification. Should we see a critical lab result, you will be contacted sooner.   If You Need Anything After Your Visit  If you have any questions or concerns for your doctor, please call our main line at [phone] and press option 4 to reach your doctor's medical assistant. If no one answers, please leave a voicemail as directed and we will return your call as soon as possible. Messages left after 4 pm will be answered the following business day.   You may also send us a message via MyChart. We typically respond to MyChart messages within 1-2 business days.  For prescription refills, please ask your pharmacy to contact our office. Our fax number is [phone].  If you have an urgent issue when the clinic is closed that cannot wait until the next business day, you can page your doctor at the number below.    Please note that while we do our best to be available for urgent issues outside of office hours, we are not available 24/7.   If you have an urgent issue and are unable to reach us, you may choose to seek medical care at your doctor's office, retail clinic, urgent care center, or emergency room.  If you have a medical emergency, please immediately call 911 or go to the emergency department.  Pager Numbers  - Dr. Kowalski: [phone]  - Dr. Moye: [phone]  - Dr. Stewart:  [phone]  In the event of inclement weather, please call our main line at [phone] for an update on the status of any delays or closures.  Dermatology Medication Tips: Please keep the boxes that topical medications come in in order to help keep track of the instructions about where and how to use these. Pharmacies typically print the medication instructions only on the boxes and not directly on the medication tubes.   If your medication is too expensive, please contact our office at [phone] option 4 or send us a message through MyChart.   We are unable to tell what your co-pay for medications will be in advance as this is different depending on your insurance coverage. However, we may be able to find a substitute medication at lower cost or fill out paperwork to get insurance to cover a needed medication.   If a prior authorization is required to get your medication covered by your insurance company, please allow us 1-2 business days to complete this process.  Drug prices often vary depending on where the prescription is filled and some pharmacies may offer cheaper prices.  The website www.goodrx.com contains coupons for medications through different pharmacies. The prices here do not account for what the cost may be with help from insurance (it may be cheaper with your insurance), but the website can give you the price if you did not use any insurance.  - You can print the associated coupon and take it with   your prescription to the pharmacy.  - You may also stop by our office during regular business hours and pick up a GoodRx coupon card.  - If you need your prescription sent electronically to a different pharmacy, notify our office through [place] MyChart or by phone at [phone] option 4.     Si Usted Necesita Algo Despus de Su Visita  Tambin puede enviarnos un mensaje a travs de MyChart. Por lo general respondemos a los mensajes de MyChart en el transcurso de 1 a 2  das hbiles.  Para renovar recetas, por favor pida a su farmacia que se ponga en contacto con nuestra oficina. Nuestro nmero de fax es el [phone].  Si tiene un asunto urgente cuando la clnica est cerrada y que no puede esperar hasta el siguiente da hbil, puede llamar/localizar a su doctor(a) al nmero que aparece a continuacin.   Por favor, tenga en cuenta que aunque hacemos todo lo posible para estar disponibles para asuntos urgentes fuera del horario de oficina, no estamos disponibles las 24 horas del da, los 7 das de la semana.   Si tiene un problema urgente y no puede comunicarse con nosotros, puede optar por buscar atencin mdica  en el consultorio de su doctor(a), en una clnica privada, en un centro de atencin urgente o en una sala de emergencias.  Si tiene una emergencia mdica, por favor llame inmediatamente al 911 o vaya a la sala de emergencias.  Nmeros de bper  - Dr. Kowalski: [phone]  - Dra. Moye: [phone]  - Dra. Stewart: [phone]  En caso de inclemencias del tiempo, por favor llame a nuestra lnea principal al [phone] para una actualizacin sobre el estado de cualquier retraso o cierre.  Consejos para la medicacin en dermatologa: Por favor, guarde las cajas en las que vienen los medicamentos de uso tpico para ayudarle a seguir las instrucciones sobre dnde y cmo usarlos. Las farmacias generalmente imprimen las instrucciones del medicamento slo en las cajas y no directamente en los tubos del medicamento.   Si su medicamento es muy caro, por favor, pngase en contacto con nuestra oficina llamando al [phone] y presione la opcin 4 o envenos un mensaje a travs de MyChart.   No podemos decirle cul ser su copago por los medicamentos por adelantado ya que esto es diferente dependiendo de la cobertura de su seguro. Sin embargo, es posible que podamos encontrar un medicamento sustituto a menor costo o llenar un formulario para que el  seguro cubra el medicamento que se considera necesario.   Si se requiere una autorizacin previa para que su compaa de seguros cubra su medicamento, por favor permtanos de 1 a 2 das hbiles para completar este proceso.  Los precios de los medicamentos varan con frecuencia dependiendo del lugar de dnde se surte la receta y alguna farmacias pueden ofrecer precios ms baratos.  El sitio web www.goodrx.com tiene cupones para medicamentos de diferentes farmacias. Los precios aqu no tienen en cuenta lo que podra costar con la ayuda del seguro (puede ser ms barato con su seguro), pero el sitio web puede darle el precio si no utiliz ningn seguro.  - Puede imprimir el cupn correspondiente y llevarlo con su receta a la farmacia.  - Tambin puede pasar por nuestra oficina durante el horario de atencin regular y recoger una tarjeta de cupones de GoodRx.  - Si necesita que su receta se enve electrnicamente a una farmacia diferente, informe a nuestra oficina a travs de MyChart de [place]   o por telfono llamando al [phone] y presione la opcin 4.  

## 2022-06-06 ENCOUNTER — Encounter: Payer: Self-pay | Admitting: Dermatology

## 2022-06-09 ENCOUNTER — Encounter: Payer: Self-pay | Admitting: Dermatology

## 2022-11-17 IMAGING — DX DG CHEST 1V PORT
1 series · 1 of 1 positions shown · non-contrast
Comparison: January 01, 2020 chest CT and CT angiogram chest

CLINICAL DATA: Weakness

EXAM:
PORTABLE CHEST 1 VIEW

[chest ap]
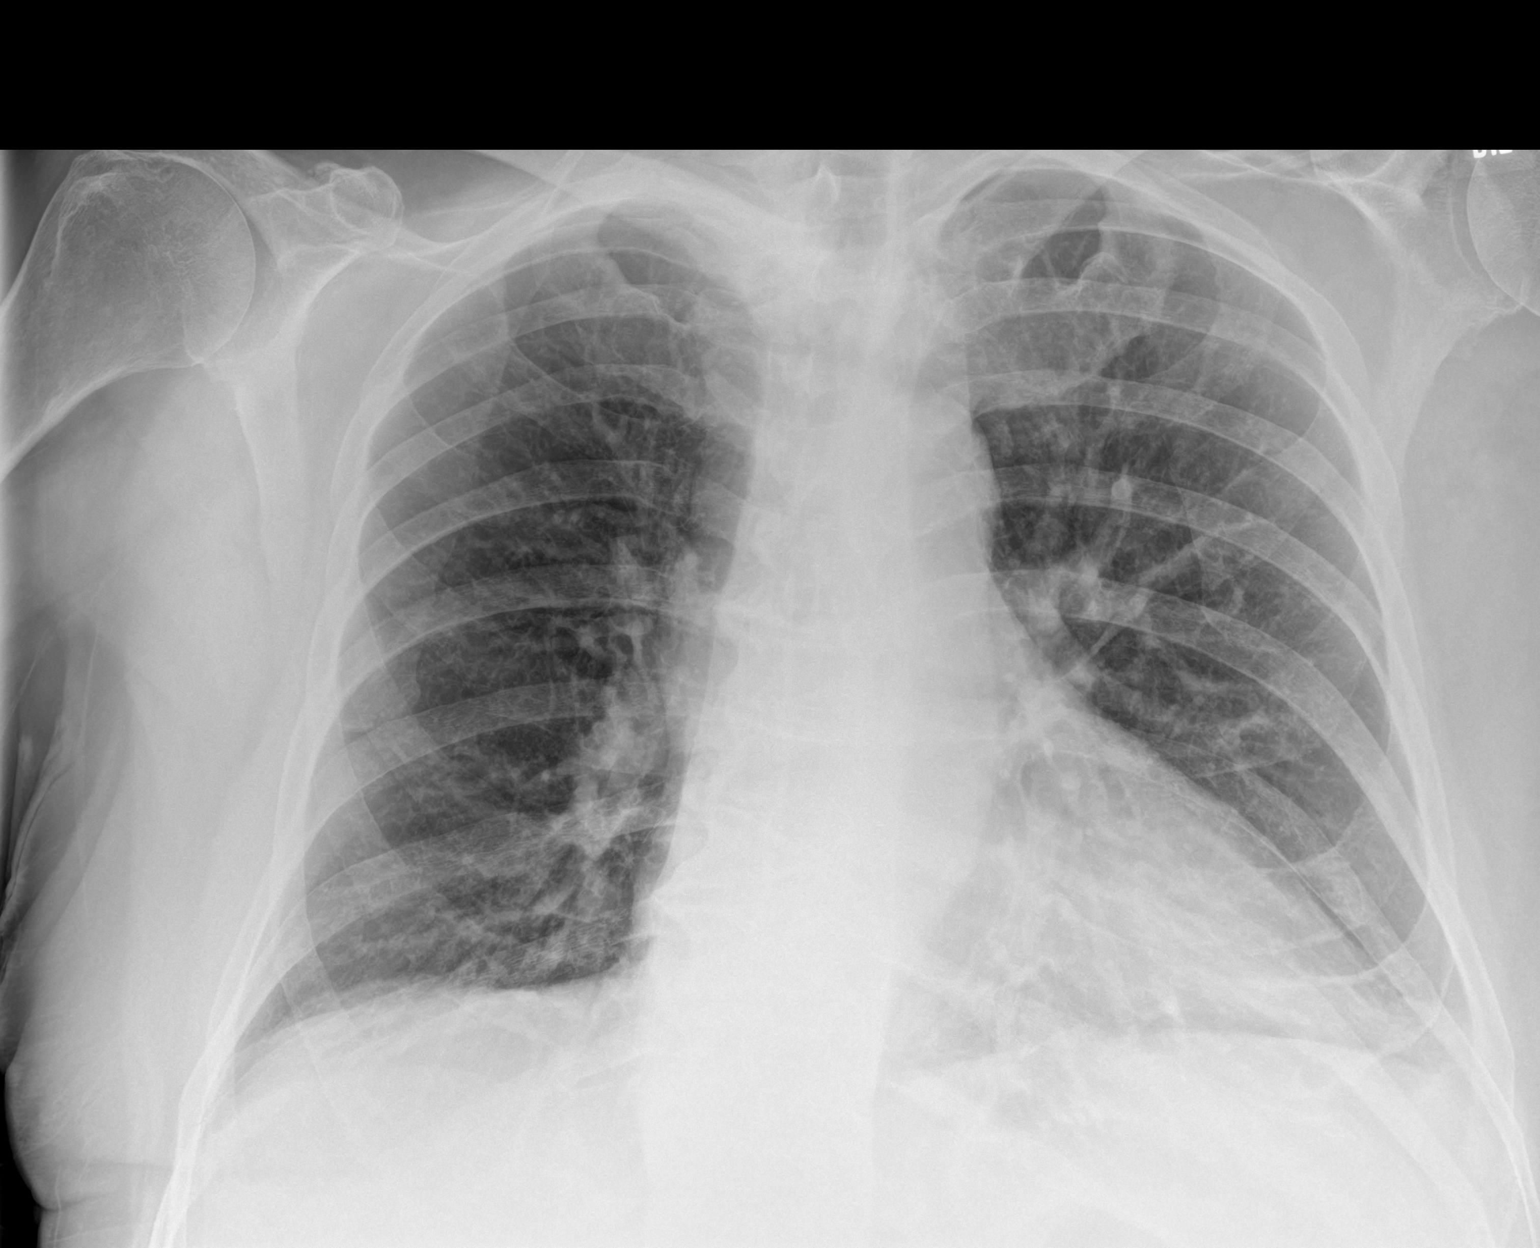

[1 of 1 positions shown; findings below may reference images not displayed]

FINDINGS: No edema or airspace opacity. Heart is borderline enlarged with
pulmonary vascularity normal. No adenopathy. There is an old healed
fracture of the left clavicle. There is degenerative change in the
right shoulder.
IMPRESSION: No edema or airspace opacity.  Heart borderline prominent.

## 2022-11-27 ENCOUNTER — Encounter: Payer: Self-pay | Admitting: Dermatology

## 2022-11-27 ENCOUNTER — Ambulatory Visit (INDEPENDENT_AMBULATORY_CARE_PROVIDER_SITE_OTHER): Payer: Medicare Other | Admitting: Dermatology

## 2022-11-27 VITALS — BP 119/70 | HR 70

## 2022-11-27 DIAGNOSIS — D2339 Other benign neoplasm of skin of other parts of face: Secondary | ICD-10-CM | POA: Diagnosis not present

## 2022-11-27 DIAGNOSIS — L578 Other skin changes due to chronic exposure to nonionizing radiation: Secondary | ICD-10-CM | POA: Diagnosis not present

## 2022-11-27 DIAGNOSIS — D489 Neoplasm of uncertain behavior, unspecified: Secondary | ICD-10-CM

## 2022-11-27 DIAGNOSIS — L820 Inflamed seborrheic keratosis: Secondary | ICD-10-CM | POA: Diagnosis not present

## 2022-11-27 DIAGNOSIS — W908XXA Exposure to other nonionizing radiation, initial encounter: Secondary | ICD-10-CM

## 2022-11-27 DIAGNOSIS — Z85828 Personal history of other malignant neoplasm of skin: Secondary | ICD-10-CM | POA: Diagnosis not present

## 2022-11-27 DIAGNOSIS — L821 Other seborrheic keratosis: Secondary | ICD-10-CM

## 2022-11-27 NOTE — Patient Instructions (Addendum)
Cryotherapy Aftercare  Wash gently with soap and water everyday.   Apply Vaseline and Band-Aid daily until healed.   Seborrheic Keratosis  What causes seborrheic keratoses? Seborrheic keratoses are harmless, common skin growths that first appear during adult life.  As time goes by, more growths appear.  Some people may develop a large number of them.  Seborrheic keratoses appear on both covered and uncovered body parts.  They are not caused by sunlight.  The tendency to develop seborrheic keratoses can be inherited.  They vary in color from skin-colored to gray, brown, or even black.  They can be either smooth or have a rough, warty surface.   Seborrheic keratoses are superficial and look as if they were stuck on the skin.  Under the microscope this type of keratosis looks like layers upon layers of skin.  That is why at times the top layer may seem to fall off, but the rest of the growth remains and re-grows.    Treatment Seborrheic keratoses do not need to be treated, but can easily be removed in the office.  Seborrheic keratoses often cause symptoms when they rub on clothing or jewelry.  Lesions can be in the way of shaving.  If they become inflamed, they can cause itching, soreness, or burning.  Removal of a seborrheic keratosis can be accomplished by freezing, burning, or surgery. If any spot bleeds, scabs, or grows rapidly, please return to have it checked, as these can be an indication of a skin cancer.       Biopsy Wound Care Instructions  Leave the original bandage on for 24 hours if possible.  If the bandage becomes soaked or soiled before that time, it is OK to remove it and examine the wound.  A small amount of post-operative bleeding is normal.  If excessive bleeding occurs, remove the bandage, place gauze over the site and apply continuous pressure (no peeking) over the area for 30 minutes. If this does not work, please call our clinic as soon as possible or page your doctor if  it is after hours.   Once a day, cleanse the wound with soap and water. It is fine to shower. If a thick crust develops you may use a Q-tip dipped into dilute hydrogen peroxide (mix 1:1 with water) to dissolve it.  Hydrogen peroxide can slow the healing process, so use it only as needed.    After washing, apply petroleum jelly (Vaseline) or an antibiotic ointment if your doctor prescribed one for you, followed by a bandage.    For best healing, the wound should be covered with a layer of ointment at all times. If you are not able to keep the area covered with a bandage to hold the ointment in place, this may mean re-applying the ointment several times a day.  Continue this wound care until the wound has healed and is no longer open.   Itching and mild discomfort is normal during the healing process. However, if you develop pain or severe itching, please call our office.   If you have any discomfort, you can take Tylenol (acetaminophen) or ibuprofen as directed on the bottle. (Please do not take these if you have an allergy to them or cannot take them for another reason).  Some redness, tenderness and white or yellow material in the wound is normal healing.  If the area becomes very sore and red, or develops a thick yellow-green material (pus), it may be infected; please notify us.  If you have stitches, return to clinic as directed to have the stitches removed. You will continue wound care for 2-3 days after the stitches are removed.   Wound healing continues for up to one year following surgery. It is not unusual to experience pain in the scar from time to time during the interval.  If the pain becomes severe or the scar thickens, you should notify the office.    A slight amount of redness in a scar is expected for the first six months.  After six months, the redness will fade and the scar will soften and fade.  The color difference becomes less noticeable with time.  If there are any problems,  return for a post-op surgery check at your earliest convenience.  To improve the appearance of the scar, you can use silicone scar gel, cream, or sheets (such as Mederma or Serica) every night for up to one year. These are available over the counter (without a prescription).  Please call our office at [phone] for any questions or concerns.  Due to recent changes in healthcare laws, you may see results of your pathology and/or laboratory studies on MyChart before the doctors have had a chance to review them. We understand that in some cases there may be results that are confusing or concerning to you. Please understand that not all results are received at the same time and often the doctors may need to interpret multiple results in order to provide you with the best plan of care or course of treatment. Therefore, we ask that you please give [place] 2 business days to thoroughly review all your results before contacting the office for clarification. Should we see a critical lab result, you will be contacted sooner.   If You Need Anything After Your Visit  If you have any questions or concerns for your doctor, please call our main line at [phone] and press option 4 to reach your doctor's medical assistant. If no one answers, please leave a voicemail as directed and we will return your call as soon as possible. Messages left after 4 pm will be answered the following business day.   You may also send [place] a message via MyChart. We typically respond to MyChart messages within 1-2 business days.  For prescription refills, please ask your pharmacy to contact our office. Our fax number is [phone].  If you have an urgent issue when the clinic is closed that cannot wait until the next business day, you can page your doctor at the number below.    Please note that while we do our best to be available for urgent issues outside of office hours, we are not available 24/7.   If you have an urgent issue  and are unable to reach [place], you may choose to seek medical care at your doctor's office, retail clinic, urgent care center, or emergency room.  If you have a medical emergency, please immediately call 911 or go to the emergency department.  Pager Numbers  - Dr. Gwen Pounds: [phone]  - Dr. Neale Burly: [phone]  - Dr. Roseanne Reno: [phone]  In the event of inclement weather, please call our main line at [phone] for an update on the status of any delays or closures.  Dermatology Medication Tips: Please keep the boxes that topical medications come in in order to help keep track of the instructions about where and how to use these. Pharmacies typically print the medication instructions only on the boxes and not directly on the medication  tubes.   If your medication is too expensive, please contact our office at [phone] option 4 or send [place] a message through MyChart.   We are unable to tell what your co-pay for medications will be in advance as this is different depending on your insurance coverage. However, we may be able to find a substitute medication at lower cost or fill out paperwork to get insurance to cover a needed medication.   If a prior authorization is required to get your medication covered by your insurance company, please allow [place] 1-2 business days to complete this process.  Drug prices often vary depending on where the prescription is filled and some pharmacies may offer cheaper prices.  The website www.goodrx.com contains coupons for medications through different pharmacies. The prices here do not account for what the cost may be with help from insurance (it may be cheaper with your insurance), but the website can give you the price if you did not use any insurance.  - You can print the associated coupon and take it with your prescription to the pharmacy.  - You may also stop by our office during regular business hours and pick up a GoodRx coupon card.  - If you need  your prescription sent electronically to a different pharmacy, notify our office through [facility] or by phone at [phone] option 4.     Si Usted Necesita Algo Despus de Su Visita  Tambin puede enviarnos un mensaje a travs de Clinical cytogeneticist. Por lo general respondemos a los mensajes de MyChart en el transcurso de 1 a 2 das hbiles.  Para renovar recetas, por favor pida a su farmacia que se ponga en contacto con nuestra oficina. Annie Sable de fax es [place] [phone].  Si tiene un asunto urgente cuando la clnica est cerrada y que no puede esperar hasta el siguiente da hbil, puede llamar/localizar a su doctor(a) al nmero que aparece a continuacin.   Por favor, tenga en cuenta que aunque hacemos todo lo posible para estar disponibles para asuntos urgentes fuera del horario de [place], no estamos disponibles las 24 horas del da, los 7 [address]  [address] de la [place].   Si tiene un problema urgente y no puede comunicarse con nosotros, puede optar por buscar atencin mdica  en el consultorio de su doctor(a), en una clnica privada, en un centro de atencin urgente o en una sala de emergencias.  Si tiene Engineer, drilling, por favor llame inmediatamente al 911 o vaya a la sala de emergencias.  Nmeros de bper  - Dr. Gwen Pounds: [phone]  - Dra. Moye: [phone]  - Dra. Roseanne Reno: [phone]  En caso de inclemencias del [place], por favor llame a Lacy Duverney principal al [phone] para una actualizacin sobre el [place] de cualquier retraso o cierre.  Consejos para la medicacin en dermatologa: Por favor, guarde las cajas en las que vienen los medicamentos de uso tpico para ayudarle a seguir las instrucciones sobre dnde y cmo usarlos. Las farmacias generalmente imprimen las instrucciones del medicamento slo en las cajas y no directamente en los tubos del [place].   Si su medicamento es muy caro, por favor, pngase en contacto con Rolm Gala llamando al  [phone] y presione la opcin 4 o envenos un mensaje a travs de Clinical cytogeneticist.   No podemos decirle cul ser su copago por los medicamentos por adelantado ya que esto es diferente dependiendo de la cobertura de su seguro. Sin embargo, es posible que podamos encontrar un medicamento sustituto a Engineering geologist  o llenar un formulario para que el seguro cubra el medicamento que se considera necesario.   Si se requiere una autorizacin previa para que su compaa de seguros [place] su medicamento, por favor permtanos de 1 a 2 das hbiles para completar [address]  Los precios de los medicamentos varan con frecuencia dependiendo del Environmental consultant de dnde se surte la receta y alguna farmacias pueden ofrecer precios ms baratos.  El sitio web www.goodrx.com tiene cupones para medicamentos de Health and safety inspector. Los precios aqu no tienen en cuenta lo que podra costar con la ayuda del seguro (puede ser ms barato con su seguro), pero el sitio web puede darle el precio si no utiliz Tourist information centre manager.  - Puede imprimir el cupn correspondiente y llevarlo con su receta a la farmacia.  - Tambin puede pasar por nuestra oficina durante el horario de atencin regular y Education officer, museum una tarjeta de cupones de GoodRx.  - Si necesita que su receta se enve electrnicamente a una farmacia diferente, informe a nuestra oficina a travs de MyChart de [place] o por telfono llamando al [phone] y presione la opcin 4.

## 2022-11-27 NOTE — Progress Notes (Signed)
Follow-Up Visit   Subjective  Eric Hanna is a 77 y.o. male who presents for the following: patient reports recent surgery at left neck has noticed a bump near excision site and is also concerned with a spot at scalp he noticed.  The patient has spots, moles and lesions to be evaluated, some may be new or changing and the patient may have concern these could be cancer.   The following portions of the chart were reviewed this encounter and updated as appropriate: medications, allergies, medical history  Review of Systems:  No other skin or systemic complaints except as noted in HPI or Assessment and Plan.  Objective  Well appearing patient in no apparent distress; mood and affect are within normal limits.   A focused examination was performed of the following areas: Scalp , left neck   Relevant exam findings are noted in the Assessment and Plan.  left interior lateral neck x 1 Erythematous stuck-on, waxy papule or plaque  right superior lateral forehead/hairline 0.3 cm gray blue macule         Assessment & Plan   SEBORRHEIC KERATOSIS - Stuck-on, waxy, tan-brown papules and/or plaques  - Benign-appearing - Discussed benign etiology and prognosis. - Observe - Call for any changes  ACTINIC DAMAGE - chronic, secondary to cumulative UV radiation exposure/sun exposure over time - diffuse scaly erythematous macules with underlying dyspigmentation - Recommend daily broad spectrum sunscreen SPF 30+ to sun-exposed areas, reapply every 2 hours as needed.  - Recommend staying in the shade or wearing long sleeves, sun glasses (UVA+UVB protection) and wide brim hats (4-inch brim around the entire circumference of the hat). - Call for new or changing lesions.  HISTORY OF BASAL CELL CARCINOMA OF THE SKIN - No evidence of recurrence today - Recommend regular full body skin exams - Recommend daily broad spectrum sunscreen SPF 30+ to sun-exposed areas, reapply every 2 hours as  needed.  - Call if any new or changing lesions are noted between office visits  Inflamed seborrheic keratosis left interior lateral neck x 1  Symptomatic, irritating, patient would like treated.  Destruction of lesion - left interior lateral neck x 1 Complexity: simple   Destruction method: cryotherapy   Informed consent: discussed and consent obtained   Timeout:  patient name, date of birth, surgical site, and procedure verified Lesion destroyed using liquid nitrogen: Yes   Region frozen until ice ball extended beyond lesion: Yes   Cryotherapy cycles:  2 Outcome: patient tolerated procedure well with no complications   Post-procedure details: wound care instructions given   Additional details:  Prior to procedure, discussed risks of blister formation, small wound, skin dyspigmentation, or rare scar following cryotherapy. Recommend Vaseline ointment to treated areas while healing.   Neoplasm of uncertain behavior right superior lateral forehead/hairline  Epidermal / dermal shaving  Lesion diameter (cm):  0.3 Informed consent: discussed and consent obtained   Timeout: patient name, date of birth, surgical site, and procedure verified   Procedure prep:  Patient was prepped and draped in usual sterile fashion Prep type:  Isopropyl alcohol Anesthesia: the lesion was anesthetized in a standard fashion   Anesthetic:  1% lidocaine w/ epinephrine 1-100,000 buffered w/ 8.4% NaHCO3 Instrument used: flexible razor blade   Hemostasis achieved with: pressure, aluminum chloride and electrodesiccation   Outcome: patient tolerated procedure well   Post-procedure details: sterile dressing applied and wound care instructions given   Dressing type: bandage and petrolatum    Specimen 1 - Surgical  pathology Differential Diagnosis: blue nevus r/o other   Check Margins: No  Blue nevus r/o other   Actinic skin damage  Seborrheic keratosis  History of basal cell carcinoma   Return for keep  follow up as scheduled 04/2023.  IAsher Muir, CMA, am acting as scribe for Armida Sans, MD.  Documentation: I have reviewed the above documentation for accuracy and completeness, and I agree with the above.  Armida Sans, MD

## 2022-12-04 ENCOUNTER — Telehealth: Payer: Self-pay

## 2022-12-04 NOTE — Telephone Encounter (Signed)
Discussed biopsy results with pt  °

## 2022-12-04 NOTE — Telephone Encounter (Signed)
-----   Message from Armida Sans sent at 12/04/2022  1:08 PM EDT ----- Diagnosis Skin , right superior lateral forehead/hairline BLUE NEVUS  Benign mole = blue nevus No further treatment needed. May persist or recur.  Recheck next visit

## 2022-12-12 ENCOUNTER — Other Ambulatory Visit: Payer: Self-pay | Admitting: Internal Medicine

## 2022-12-12 DIAGNOSIS — I7121 Aneurysm of the ascending aorta, without rupture: Secondary | ICD-10-CM

## 2023-01-14 ENCOUNTER — Ambulatory Visit
Admission: RE | Admit: 2023-01-14 | Discharge: 2023-01-14 | Disposition: A | Discharge status: Home / Self Care | Payer: Medicare Other | Source: Ambulatory Visit | Attending: Internal Medicine | Admitting: Internal Medicine

## 2023-01-14 DIAGNOSIS — I7121 Aneurysm of the ascending aorta, without rupture: Secondary | ICD-10-CM | POA: Diagnosis present

## 2023-01-14 LAB — POCT I-STAT CREATININE: Creatinine, Ser: 1.1 mg/dL (ref 0.61–1.24)

## 2023-01-14 MED ORDER — IOHEXOL 300 MG/ML  SOLN
75.0000 mL | Freq: Once | INTRAMUSCULAR | Status: AC | PRN
  Administered 2023-01-14: 75 mL via INTRAVENOUS

## 2023-01-25 ENCOUNTER — Other Ambulatory Visit: Payer: Medicare Other

## 2023-02-04 ENCOUNTER — Other Ambulatory Visit: Payer: Self-pay | Admitting: Internal Medicine

## 2023-02-04 DIAGNOSIS — I7121 Aneurysm of the ascending aorta, without rupture: Secondary | ICD-10-CM

## 2023-02-15 ENCOUNTER — Ambulatory Visit
Admission: RE | Admit: 2023-02-15 | Discharge: 2023-02-15 | Disposition: A | Discharge status: Home / Self Care | Payer: Medicare Other | Source: Ambulatory Visit | Attending: Internal Medicine | Admitting: Internal Medicine

## 2023-02-15 DIAGNOSIS — I7121 Aneurysm of the ascending aorta, without rupture: Secondary | ICD-10-CM | POA: Insufficient documentation

## 2023-02-15 MED ORDER — IOHEXOL 350 MG/ML SOLN
100.0000 mL | Freq: Once | INTRAVENOUS | Status: AC | PRN
  Administered 2023-02-15: 100 mL via INTRAVENOUS

## 2023-04-18 ENCOUNTER — Ambulatory Visit: Payer: Medicare Other | Admitting: Dermatology

## 2023-04-18 DIAGNOSIS — D485 Neoplasm of uncertain behavior of skin: Secondary | ICD-10-CM

## 2023-04-18 DIAGNOSIS — D229 Melanocytic nevi, unspecified: Secondary | ICD-10-CM

## 2023-04-18 DIAGNOSIS — Z1283 Encounter for screening for malignant neoplasm of skin: Secondary | ICD-10-CM | POA: Diagnosis not present

## 2023-04-18 DIAGNOSIS — W908XXA Exposure to other nonionizing radiation, initial encounter: Secondary | ICD-10-CM | POA: Diagnosis not present

## 2023-04-18 DIAGNOSIS — D492 Neoplasm of unspecified behavior of bone, soft tissue, and skin: Secondary | ICD-10-CM

## 2023-04-18 DIAGNOSIS — L821 Other seborrheic keratosis: Secondary | ICD-10-CM

## 2023-04-18 DIAGNOSIS — Z85828 Personal history of other malignant neoplasm of skin: Secondary | ICD-10-CM

## 2023-04-18 DIAGNOSIS — L814 Other melanin hyperpigmentation: Secondary | ICD-10-CM

## 2023-04-18 DIAGNOSIS — D1801 Hemangioma of skin and subcutaneous tissue: Secondary | ICD-10-CM

## 2023-04-18 DIAGNOSIS — L570 Actinic keratosis: Secondary | ICD-10-CM | POA: Diagnosis not present

## 2023-04-18 DIAGNOSIS — L989 Disorder of the skin and subcutaneous tissue, unspecified: Secondary | ICD-10-CM | POA: Diagnosis not present

## 2023-04-18 DIAGNOSIS — L853 Xerosis cutis: Secondary | ICD-10-CM

## 2023-04-18 DIAGNOSIS — L578 Other skin changes due to chronic exposure to nonionizing radiation: Secondary | ICD-10-CM

## 2023-04-18 NOTE — Progress Notes (Unsigned)
Follow-Up Visit   Subjective  Eric Hanna is a 77 y.o. male who presents for the following: Skin Cancer Screening and Full Body Skin Exam  The patient presents for Total-Body Skin Exam (TBSE) for skin cancer screening and mole check. The patient has spots, moles and lesions to be evaluated, some may be new or changing and the patient may have concern these could be cancer. He has a spot on his left thigh, present for years, but tender off and on.   The following portions of the chart were reviewed this encounter and updated as appropriate: medications, allergies, medical history  Review of Systems:  No other skin or systemic complaints except as noted in HPI or Assessment and Plan.  Objective  Well appearing patient in no apparent distress; mood and affect are within normal limits.  A full examination was performed including scalp, head, eyes, ears, nose, lips, neck, chest, axillae, abdomen, back, buttocks, bilateral upper extremities, bilateral lower extremities, hands, feet, fingers, toes, fingernails, and toenails. All findings within normal limits unless otherwise noted below.   Relevant physical exam findings are noted in the Assessment and Plan.  temples x 4 (4) Erythematous thin papules/macules with gritty scale.  Left Anterior Lateral Thigh 1.0 cm firm tender flesh papule    Assessment & Plan   SKIN CANCER SCREENING PERFORMED TODAY.  ACTINIC DAMAGE - Chronic condition, secondary to cumulative UV/sun exposure - diffuse scaly erythematous macules with underlying dyspigmentation - Recommend daily broad spectrum sunscreen SPF 30+ to sun-exposed areas, reapply every 2 hours as needed.  - Staying in the shade or wearing long sleeves, sun glasses (UVA+UVB protection) and wide brim hats (4-inch brim around the entire circumference of the hat) are also recommended for sun protection.  - Call for new or changing lesions.  LENTIGINES, SEBORRHEIC KERATOSES, HEMANGIOMAS - Benign  normal skin lesions - Benign-appearing - Call for any changes  MELANOCYTIC NEVI - Tan-brown and/or pink-flesh-colored symmetric macules and papules - Benign appearing on exam today - Observation - Call clinic for new or changing moles - Recommend daily use of broad spectrum spf 30+ sunscreen to sun-exposed areas.   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  - Call if any new or changing lesions are noted between office visits  Xerosis - diffuse xerotic patches - recommend gentle, hydrating skin care - gentle skin care handout given - Samples of CeraVe Cream and Eucerin Advanced.   AK (ACTINIC KERATOSIS) (4) temples x 4 (4) Actinic keratoses are precancerous spots that appear secondary to cumulative UV radiation exposure/sun exposure over time. They are chronic with expected duration over 1 year. A portion of actinic keratoses will progress to squamous cell carcinoma of the skin. It is not possible to reliably predict which spots will progress to skin cancer and so treatment is recommended to prevent development of skin cancer.  Recommend daily broad spectrum sunscreen SPF 30+ to sun-exposed areas, reapply every 2 hours as needed.  Recommend staying in the shade or wearing long sleeves, sun glasses (UVA+UVB protection) and wide brim hats (4-inch brim around the entire circumference of the hat). Call for new or changing lesions. Destruction of lesion - temples x 4 (4) Complexity: simple   Destruction method: cryotherapy   Informed consent: discussed and consent obtained  Timeout:  patient name, date of birth, surgical site, and procedure verified Lesion destroyed using liquid nitrogen: Yes   Region frozen until ice ball extended beyond lesion: Yes   Outcome: patient  tolerated procedure well with no complications   Post-procedure details: wound care instructions given   NEOPLASM OF UNCERTAIN BEHAVIOR OF SKIN Left Anterior Lateral Thigh Skin excision  Total excision diameter (cm):  1.1 Informed consent: discussed and consent obtained   Timeout: patient name, date of birth, surgical site, and procedure verified   Anesthesia: the lesion was anesthetized in a standard fashion   Anesthetic:  1% lidocaine w/ epinephrine 1-100,000 buffered w/ 8.4% NaHCO3 Instrument used comment:  Flat blade Hemostasis achieved with: pressure   Hemostasis achieved with comment:  Electrocautery Outcome: patient tolerated procedure well with no complications   Post-procedure details: sterile dressing applied and wound care instructions given   Dressing type: bandage (Mupirocin)   Specimen 1 - Surgical pathology Differential Diagnosis: Leiomyoma vs Glomus Tumor vs other Check Margins: yes Leiomyoma vs Glomus tumor vs other. Simple excision performed today.   Patient advised he may need additional treatment pending diagnosis.  ACTINIC SKIN DAMAGE   SKIN CANCER SCREENING   LENTIGO   MELANOCYTIC NEVUS, UNSPECIFIED LOCATION   HISTORY OF BASAL CELL CARCINOMA   XEROSIS CUTIS   Return in about 1 year (around 04/17/2024) for TBSE.  Wendee Hanna, CMA, am acting as scribe for Armida Sans, MD .   Documentation: I have reviewed the above documentation for accuracy and completeness, and I agree with the above.  Armida Sans, MD

## 2023-04-18 NOTE — Patient Instructions (Addendum)

## 2023-04-23 ENCOUNTER — Encounter: Payer: Self-pay | Admitting: Dermatology

## 2023-04-26 LAB — SURGICAL PATHOLOGY

## 2023-04-29 ENCOUNTER — Telehealth: Payer: Self-pay

## 2023-04-29 NOTE — Telephone Encounter (Signed)
Patient informed of pathology results 

## 2023-04-29 NOTE — Telephone Encounter (Addendum)
Tried calling patient regarding bx results. No answer. LM for patient to return call.   ----- Message from Armida Sans sent at 04/29/2023 11:19 AM EST ----- FINAL DIAGNOSIS        1. Skin, left anterior lateral thigh :       ANGIOMYOLIPOMATOUS LESION, SEE DESCRIPTION    Benign Angiolipoma = harmless fatty growth May persist or recur If persistent or recurrent, may require additional procedure Recheck next visit

## 2023-04-29 NOTE — Telephone Encounter (Signed)
-----   Message from Armida Sans sent at 04/29/2023 11:19 AM EST ----- FINAL DIAGNOSIS        1. Skin, left anterior lateral thigh :       ANGIOMYOLIPOMATOUS LESION, SEE DESCRIPTION    Benign Angiolipoma = harmless fatty growth May persist or recur If persistent or recurrent, may require additional procedure Recheck next visit

## 2023-06-23 ENCOUNTER — Other Ambulatory Visit: Payer: Self-pay

## 2023-06-23 DIAGNOSIS — R1084 Generalized abdominal pain: Secondary | ICD-10-CM | POA: Diagnosis present

## 2023-06-23 DIAGNOSIS — N132 Hydronephrosis with renal and ureteral calculous obstruction: Secondary | ICD-10-CM | POA: Diagnosis not present

## 2023-06-23 DIAGNOSIS — E119 Type 2 diabetes mellitus without complications: Secondary | ICD-10-CM | POA: Diagnosis not present

## 2023-06-23 DIAGNOSIS — Z7982 Long term (current) use of aspirin: Secondary | ICD-10-CM | POA: Diagnosis not present

## 2023-06-23 DIAGNOSIS — I10 Essential (primary) hypertension: Secondary | ICD-10-CM | POA: Diagnosis not present

## 2023-06-23 NOTE — ED Triage Notes (Addendum)
 Pt sts " I have a terrible case of constipation" I am having back pain and not even passing gas. Pt sts that the last normal BM was a couple of days ago. Pt does advise that he has been taking a GLP-1 medication a month ago.

## 2023-06-24 ENCOUNTER — Emergency Department
Admission: EM | Admit: 2023-06-24 | Discharge: 2023-06-24 | Disposition: A | Discharge status: Home / Self Care | Payer: Medicare Other | Attending: Emergency Medicine | Admitting: Emergency Medicine

## 2023-06-24 ENCOUNTER — Emergency Department: Payer: Medicare Other

## 2023-06-24 DIAGNOSIS — R1084 Generalized abdominal pain: Secondary | ICD-10-CM

## 2023-06-24 DIAGNOSIS — N132 Hydronephrosis with renal and ureteral calculous obstruction: Secondary | ICD-10-CM | POA: Diagnosis not present

## 2023-06-24 DIAGNOSIS — N200 Calculus of kidney: Secondary | ICD-10-CM

## 2023-06-24 LAB — COMPREHENSIVE METABOLIC PANEL
ALT: 21 U/L (ref 0–44)
AST: 41 U/L (ref 15–41)
Albumin: 4.2 g/dL (ref 3.5–5.0)
Alkaline Phosphatase: 51 U/L (ref 38–126)
Anion gap: 13 (ref 5–15)
BUN: 25 mg/dL — ABNORMAL HIGH (ref 8–23)
CO2: 24 mmol/L (ref 22–32)
Calcium: 9.1 mg/dL (ref 8.9–10.3)
Chloride: 103 mmol/L (ref 98–111)
Creatinine, Ser: 1.5 mg/dL — ABNORMAL HIGH (ref 0.61–1.24)
GFR, Estimated: 47 mL/min — ABNORMAL LOW (ref >60)
Glucose, Bld: 118 mg/dL — ABNORMAL HIGH (ref 70–99)
Potassium: 3.8 mmol/L (ref 3.5–5.1)
Sodium: 140 mmol/L (ref 135–145)
Total Bilirubin: 1 mg/dL (ref 0.0–1.2)
Total Protein: 7.1 g/dL (ref 6.5–8.1)

## 2023-06-24 LAB — URINALYSIS, ROUTINE W REFLEX MICROSCOPIC
Bilirubin Urine: NEGATIVE
Glucose, UA: NEGATIVE mg/dL
Ketones, ur: NEGATIVE mg/dL
Nitrite: NEGATIVE
Protein, ur: NEGATIVE mg/dL
Specific Gravity, Urine: 1.044 — ABNORMAL HIGH (ref 1.005–1.030)
pH: 5 (ref 5.0–8.0)

## 2023-06-24 LAB — CBC WITH DIFFERENTIAL/PLATELET
Abs Immature Granulocytes: 0.03 10*3/uL (ref 0.00–0.07)
Basophils Absolute: 0 10*3/uL (ref 0.0–0.1)
Basophils Relative: 0 %
Eosinophils Absolute: 0 10*3/uL (ref 0.0–0.5)
Eosinophils Relative: 0 %
HCT: 41.9 % (ref 39.0–52.0)
Hemoglobin: 14 g/dL (ref 13.0–17.0)
Immature Granulocytes: 0 %
Lymphocytes Relative: 14 %
Lymphs Abs: 1.5 10*3/uL (ref 0.7–4.0)
MCH: 30 pg (ref 26.0–34.0)
MCHC: 33.4 g/dL (ref 30.0–36.0)
MCV: 89.7 fL (ref 80.0–100.0)
Monocytes Absolute: 1.5 10*3/uL — ABNORMAL HIGH (ref 0.1–1.0)
Monocytes Relative: 13 %
Neutro Abs: 8 10*3/uL — ABNORMAL HIGH (ref 1.7–7.7)
Neutrophils Relative %: 73 %
Platelets: 149 10*3/uL — ABNORMAL LOW (ref 150–400)
RBC: 4.67 MIL/uL (ref 4.22–5.81)
RDW: 13.2 % (ref 11.5–15.5)
WBC: 11.1 10*3/uL — ABNORMAL HIGH (ref 4.0–10.5)
nRBC: 0 % (ref 0.0–0.2)

## 2023-06-24 LAB — LIPASE, BLOOD: Lipase: 23 U/L (ref 11–51)

## 2023-06-24 MED ORDER — FENTANYL CITRATE PF 50 MCG/ML IJ SOSY
50.0000 ug | PREFILLED_SYRINGE | Freq: Once | INTRAMUSCULAR | Status: AC
  Administered 2023-06-24: 50 ug via INTRAVENOUS
  Filled 2023-06-24: qty 1

## 2023-06-24 MED ORDER — MORPHINE SULFATE (PF) 4 MG/ML IV SOLN
4.0000 mg | Freq: Once | INTRAVENOUS | Status: AC
  Administered 2023-06-24: 4 mg via INTRAVENOUS
  Filled 2023-06-24 (×2): qty 1

## 2023-06-24 MED ORDER — MORPHINE SULFATE (PF) 4 MG/ML IV SOLN
4.0000 mg | Freq: Once | INTRAVENOUS | Status: AC
  Administered 2023-06-24: 4 mg via INTRAVENOUS
  Filled 2023-06-24: qty 1

## 2023-06-24 MED ORDER — OXYCODONE-ACETAMINOPHEN 5-325 MG PO TABS: 2.0000 | ORAL_TABLET | Freq: Three times a day (TID) | ORAL | 0 refills | Status: DC | PRN

## 2023-06-24 MED ORDER — POLYETHYLENE GLYCOL 3350 17 G PO PACK: 17.0000 g | PACK | Freq: Two times a day (BID) | ORAL | 0 refills | Status: DC

## 2023-06-24 MED ORDER — IOHEXOL 300 MG/ML  SOLN
100.0000 mL | Freq: Once | INTRAMUSCULAR | Status: AC | PRN
  Administered 2023-06-24: 100 mL via INTRAVENOUS

## 2023-06-24 MED ORDER — DOCUSATE SODIUM 100 MG PO CAPS: 100.0000 mg | ORAL_CAPSULE | Freq: Two times a day (BID) | ORAL | 0 refills | Status: DC

## 2023-06-24 MED ORDER — SODIUM CHLORIDE 0.9 % IV BOLUS (SEPSIS)
1000.0000 mL | Freq: Once | INTRAVENOUS | Status: AC
  Administered 2023-06-24: 1000 mL via INTRAVENOUS

## 2023-06-24 MED ORDER — ONDANSETRON HCL 4 MG/2ML IJ SOLN
4.0000 mg | Freq: Once | INTRAMUSCULAR | Status: AC
  Administered 2023-06-24: 4 mg via INTRAVENOUS
  Filled 2023-06-24: qty 2

## 2023-06-24 MED ORDER — ONDANSETRON 4 MG PO TBDP: 4.0000 mg | ORAL_TABLET | Freq: Four times a day (QID) | ORAL | 0 refills | Status: DC | PRN

## 2023-06-24 MED ORDER — TAMSULOSIN HCL 0.4 MG PO CAPS: 0.4000 mg | ORAL_CAPSULE | Freq: Every day | ORAL | 0 refills | Status: DC

## 2023-06-24 NOTE — ED Provider Notes (Signed)
 [facility] Provider Note    Event Date/Time   First MD Initiated Contact with Patient 06/24/23 0208     (approximate)   History   Constipation   HPI  Eric Hanna is a 78 y.o. male with history of previous gastric bypass, hypertension, diabetes, bladder cancer, WPW, kidney stones who presents to the emergency department with complaints of generalized abdominal pain that radiates into the back, nausea, constipation.  Reports he has not had a bowel movement or passed gas in a day.  He has been on [facility] for the past month so he is not sure if this caused him to become constipated.  He has not on any opiate pain medication.  Denies any prior history of bowel obstruction.  He is not vomiting.  He took Dulcolax and MiraLAX at home without relief.   History provided by patient, wife.    Past Medical History:  Diagnosis Date   Basal cell carcinoma 04/16/2022   L neck - Excised 05/29/22   Coronary artery disease    DDD (degenerative disc disease), cervical    Depression    Diabetes mellitus without complication (HCC)    borderline; no meds; no blood sugar checks   Diarrhea    GERD (gastroesophageal reflux disease)    Hypertension    Hypertrophy of prostate    Impotence, organic    Lower urinary tract symptoms (LUTS)    Lumbar trigger point syndrome    Malignant neoplasm of bladder (HCC)    Myocardial infarction (HCC) 2000   Obesity    OSA (obstructive sleep apnea)    CPAP   Osteoarthritis    Sleep apnea    Spondylolisthesis of cervical region    Testicular hypofunction    WPW (Wolff-Parkinson-White syndrome)     Past Surgical History:  Procedure Laterality Date   CARDIAC CATHETERIZATION     CATARACT EXTRACTION W/PHACO Left 06/05/2017   Procedure: CATARACT EXTRACTION PHACO AND INTRAOCULAR LENS PLACEMENT (IOC) LEFT;  Surgeon: Lockie Mola, MD;  Location: [facility];  Service: Ophthalmology;  Laterality: Left;  sleep apnea    CATARACT EXTRACTION W/PHACO Right 07/03/2017   Procedure: CATARACT EXTRACTION PHACO AND INTRAOCULAR LENS PLACEMENT (IOC) RIGHT;  Surgeon: Lockie Mola, MD;  Location: [facility];  Service: Ophthalmology;  Laterality: Right;  sleep apnea   CHIN IMPLANT      COLONOSCOPY     COLONOSCOPY WITH PROPOFOL N/A 09/07/2015   Procedure: COLONOSCOPY WITH PROPOFOL;  Surgeon: Scot Jun, MD;  Location: [facility];  Service: Endoscopy;  Laterality: N/A;   HAND SURGERY     STOMACH REDUCTION WITHOUT BYPASS     X-STOP IMPLANTATION      MEDICATIONS:  Prior to Admission medications   Medication Sig Start Date End Date Taking? Authorizing Provider  alendronate (FOSAMAX) 70 MG tablet Take 70 mg by mouth once a week. Take with a full glass of water on an empty stomach.    [provider]  aspirin 81 MG EC tablet aspirin 81 mg tablet,delayed release  Take 1 tablet every day by oral route.    [provider]  aspirin 81 MG tablet Take 81 mg by mouth daily.    [provider]  calcium citrate-vitamin D (CITRACAL+D) 315-200 MG-UNIT tablet Take 1 tablet by mouth daily.    [provider]  cetirizine (ZYRTEC) 10 MG tablet Take 10 mg by mouth daily.    [provider]  COENZYME Q10 PO Take 1 capsule by  mouth daily.    [provider]  colchicine 0.6 MG tablet Take 1 tablet (0.6 mg total) by mouth 2 (two) times daily. 01/02/20   Willeen Niece, MD  cyanocobalamin 1000 MCG tablet Take by mouth.    [provider]  ibuprofen (ADVIL) 600 MG tablet Take 1 tablet (600 mg total) by mouth 3 (three) times daily. 01/02/20   Willeen Niece, MD  metoprolol succinate (TOPROL-XL) 25 MG 24 hr tablet Take 25 mg by mouth daily. 11/12/19   [provider]  Multiple Vitamin (MULTIVITAMIN) capsule Take 1 capsule by mouth daily.    [provider]  mupirocin ointment (BACTROBAN) 2 % Apply 1 Application topically daily. Qd to excision site  05/29/22   Deirdre Evener, MD  nitroGLYCERIN (NITROSTAT) 0.3 MG SL tablet  02/15/20   [provider]  Omega-3 Fatty Acids (FISH OIL) 1000 MG CAPS Fish Oil    [provider]  Omega-3 Fatty Acids (FISH OIL) 1200 MG CPDR Take by mouth daily.    [provider]  omeprazole (PRILOSEC) 20 MG capsule Take 20 mg by mouth daily.    [provider]  Semaglutide 7 MG TABS Take by mouth. 02/09/21   [provider]  sertraline (ZOLOFT) 50 MG tablet Take 1 tablet (50 mg total) by mouth at bedtime. 02/23/22 05/24/22  Neysa Hotter, MD  sildenafil (VIAGRA) 100 MG tablet Take 100 mg by mouth daily as needed for erectile dysfunction.    [provider]  simvastatin (ZOCOR) 80 MG tablet Take 80 mg by mouth daily.    [provider]  simvastatin (ZOCOR) 80 MG tablet Take by mouth. 02/08/21   [provider]  terbinafine (LAMISIL) 250 MG tablet Take 1 tablet by mouth once daily 03/02/21   Deirdre Evener, MD  traZODone (DESYREL) 50 MG tablet Take 50 mg by mouth at bedtime. 11/12/16   [provider]    Physical Exam   Triage Vital Signs: ED Triage Vitals  Encounter Vitals Group     BP 06/23/23 1931 (!) 145/101     Systolic BP Percentile --      Diastolic BP Percentile --      Pulse Rate 06/23/23 1931 100     Resp 06/23/23 1931 18     Temp 06/23/23 1930 99.5 F (37.5 C)     Temp Source 06/23/23 1930 Oral     SpO2 06/23/23 1931 96 %     Weight 06/23/23 1933 230 lb (104.3 kg)     Height 06/23/23 1933 6\' 1"  (1.854 m)     Head Circumference --      Peak Flow --      Pain Score 06/23/23 1930 7     Pain Loc --      Pain Education --      Exclude from Growth Chart --     Most recent vital signs: Vitals:   06/23/23 1930 06/23/23 1931  BP:  (!) 145/101  Pulse:  100  Resp:  18  Temp: 99.5 F (37.5 C)   SpO2:  96%    CONSTITUTIONAL: Alert, responds appropriately to questions. Well-appearing; well-nourished HEAD:  Normocephalic, atraumatic EYES: Conjunctivae clear, pupils appear equal, sclera nonicteric ENT: normal nose; moist mucous membranes NECK: Supple, normal ROM CARD: RRR; S1 and S2 appreciated RESP: Normal chest excursion without splinting or tachypnea; breath sounds clear and equal bilaterally; no wheezes, no rhonchi, no rales, no hypoxia or respiratory distress, speaking full sentences ABD/GI: Distended without  tympany, fluid wave.  Mildly tender to palpation diffusely.  No guarding or rebound. BACK: The back appears normal EXT: Normal ROM in all joints; no deformity noted, no edema SKIN: Normal color for age and race; warm; no rash on exposed skin NEURO: Moves all extremities equally, normal speech PSYCH: The patient's mood and manner are appropriate.   ED Results / Procedures / Treatments   LABS: (all labs ordered are listed, but only abnormal results are displayed) Labs Reviewed  CBC WITH DIFFERENTIAL/PLATELET - Abnormal; Notable for the following components:      Result Value   WBC 11.1 (*)    Platelets 149 (*)    Neutro Abs 8.0 (*)    Monocytes Absolute 1.5 (*)    All other components within normal limits  COMPREHENSIVE METABOLIC PANEL - Abnormal; Notable for the following components:   Glucose, Bld 118 (*)    BUN 25 (*)    Creatinine, Ser 1.50 (*)    GFR, Estimated 47 (*)    All other components within normal limits  URINALYSIS, ROUTINE W REFLEX MICROSCOPIC - Abnormal; Notable for the following components:   Color, Urine YELLOW (*)    APPearance CLEAR (*)    Specific Gravity, Urine 1.044 (*)    Hgb urine dipstick MODERATE (*)    Leukocytes,Ua MODERATE (*)    Bacteria, UA RARE (*)    All other components within normal limits  URINE CULTURE  LIPASE, BLOOD     EKG:   RADIOLOGY: My personal review and interpretation of imaging: CT scan shows 8 mm UVJ stone on the right.  I have personally reviewed all radiology reports.   CT ABDOMEN PELVIS W CONTRAST Result Date:  06/24/2023 CLINICAL DATA:  Constipation symptoms EXAM: CT ABDOMEN AND PELVIS WITH CONTRAST TECHNIQUE: Multidetector CT imaging of the abdomen and pelvis was performed using the standard protocol following bolus administration of intravenous contrast. RADIATION DOSE REDUCTION: This exam was performed according to the departmental dose-optimization program which includes automated exposure control, adjustment of the mA and/or kV according to patient size and/or use of iterative reconstruction technique. CONTRAST:  OMNIPAQUE IOHEXOL 300 MG/ML  SOLN COMPARISON:  02/15/2023 FINDINGS: Lower chest: No acute abnormality. Hepatobiliary: No focal liver abnormality is seen. No gallstones, gallbladder wall thickening, or biliary dilatation. Pancreas: Unremarkable. No pancreatic ductal dilatation or surrounding inflammatory changes. Spleen: Normal in size without focal abnormality. Adrenals/Urinary Tract: Adrenal glands are within normal limits. Left kidney demonstrates few small nonobstructing calculi measuring up to 3 mm. Simple renal cyst is noted. No follow-up is recommended. The left ureter is the right kidney also demonstrates simple cysts. No follow-up is recommended. Significant hydronephrosis and hydroureter is identified secondary to a 8 mm calculus at the right UVJ. A few tiny nonobstructing stones are noted in the right kidney. Bladder is decompressed. Stomach/Bowel: No obstructive or inflammatory changes of the colon are noted. Diverticular changes noted without diverticulitis. Appendix is not well visualized and may have been surgically removed. Correlate with clinical history. No inflammatory changes are seen. Small bowel is within normal limits. Surgical changes in the stomach are again noted. Vascular/Lymphatic: Aortic atherosclerosis. No enlarged abdominal or pelvic lymph nodes. Reproductive: Prostate is unremarkable. Other: No abdominal wall hernia or abnormality. No abdominopelvic ascites.  Musculoskeletal: No acute or significant osseous findings. IMPRESSION: 8 mm right UVJ stone causing hydronephrosis. Bilateral small nonobstructing renal calculi. Scattered diverticular change without diverticulitis. Electronically Signed   By: Alcide Clever M.D.   On: 06/24/2023 03:59  PROCEDURES:  Critical Care performed: No     Procedures    IMPRESSION / MDM / ASSESSMENT AND PLAN / ED COURSE  I reviewed the triage vital signs and the nursing notes.    Patient here for abdominal pain, not able to have a bowel movement or passed gas for 24 hours.    DIFFERENTIAL DIAGNOSIS (includes but not limited to):   Constipation, bowel obstruction, colitis, diverticulitis, appendicitis, UTI, kidney stone, pyelonephritis   Patient's presentation is most consistent with acute presentation with potential threat to life or bodily function.   PLAN: Will obtain labs, urine, CT of the abdomen pelvis.  Will give IV fluids, pain and nausea medicine.   MEDICATIONS GIVEN IN ED: Medications  morphine (PF) 4 MG/ML injection 4 mg (has no administration in time range)  fentaNYL (SUBLIMAZE) injection 50 mcg (50 mcg Intravenous Given 06/24/23 0300)  ondansetron (ZOFRAN) injection 4 mg (4 mg Intravenous Given 06/24/23 0252)  sodium chloride 0.9 % bolus 1,000 mL (0 mLs Intravenous Stopped 06/24/23 0322)  iohexol (OMNIPAQUE) 300 MG/ML solution 100 mL (100 mLs Intravenous Contrast Given 06/24/23 0338)  morphine (PF) 4 MG/ML injection 4 mg (4 mg Intravenous Given 06/24/23 0537)     ED COURSE: Labs show slight leukocytosis of 11,000.  Creatinine minimally elevated at 1.5.  Patient is getting IV fluids.  Normal LFTs and lipase.  CT scan reviewed and interpreted by myself and the radiologist and shows an 8 mm right UVJ stone causing hydronephrosis.  He has diverticulosis without diverticulitis.  No bowel obstruction or significant constipation.  I suspect that the kidney stone is what is causing his pain and  nausea and I am not concerned that he needs further emergent treatment for constipation given he had a bowel movement 24 hours ago.  He reports no significant treatment with fentanyl.  Will give morphine and reassess.  Will avoid Toradol given age and AKI.   Patient reports pain has been well-controlled after morphine.  He is comfortable for plan for discharge home.  Urine shows some red blood cells, white blood cells, rare bacteria.  Will add on urine culture.  He is not having any urinary symptoms and is afebrile.  White blood cell count 11,000.  At this time I do not feel he needs to be started on antibiotics but did discuss with him strict return precautions.  He is comfortable with plan for discharge home with close outpatient follow-up with Dr. Lonna Cobb.  Will discharge with pain and nausea medication, bowel regimen to prevent constipation, Flomax.  Patient and wife are comfortable with this plan.  CONSULTS: Admission considered but patient nontoxic in appearance and pain is well-controlled.   OUTSIDE RECORDS REVIEWED: Reviewed last PCP note in September 2024.       FINAL CLINICAL IMPRESSION(S) / ED DIAGNOSES   Final diagnoses:  Generalized abdominal pain  Right kidney stone     Rx / DC Orders   ED Discharge Orders          Ordered    oxyCODONE-acetaminophen (PERCOCET) 5-325 MG tablet  Every 8 hours PRN        06/24/23 0631    ondansetron (ZOFRAN-ODT) 4 MG disintegrating tablet  Every 6 hours PRN        06/24/23 0631    tamsulosin (FLOMAX) 0.4 MG CAPS capsule  Daily        06/24/23 0631    polyethylene glycol (MIRALAX) 17 g packet  2 times daily  06/24/23 0632    docusate sodium (COLACE) 100 MG capsule  2 times daily        06/24/23 4401             Note:  This document was prepared using Dragon voice recognition software and may include unintentional dictation errors.   Soyla Bainter, Layla Maw, DO 06/24/23 [phone]

## 2023-06-24 NOTE — Discharge Instructions (Addendum)
 You are being provided a prescription for opiates (also known as narcotics) for pain control.  Opiates can be addictive and should only be used when absolutely necessary for pain control when other alternatives do not work.  We recommend you only use them for the recommended amount of time and only as prescribed.  Please do not take with other sedative medications or alcohol.  Please do not drive, operate machinery, make important decisions while taking opiates.  Please note that these medications can be addictive and have high abuse potential.  Patients can become addicted to narcotics after only taking them for a few days.  Please keep these medications locked away from children, teenagers or any family members with history of substance abuse.  Narcotic pain medicine may also make you constipated.  You may use over-the-counter medications such as MiraLAX, Colace to prevent constipation.  If you become constipated, you may use over-the-counter enemas as needed.  Itching and nausea are also common side effects of narcotic pain medication.  If you develop uncontrolled vomiting or a rash, please stop these medications and seek medical care.   I recommend that you increase your water and fiber intake. If you are not able to eat foods high in fiber, you may use Benefiber or Metamucil over-the-counter. I also recommend you use MiraLAX 1-2 times a day and Colace 100 mg twice a day to help with bowel movements. These medications are over the counter.  You may use other over-the-counter medications such as Dulcolax, Fleet enemas, magnesium citrate as needed for constipation. Please note that some of these medications may cause you to have abdominal cramping which is normal. If you develop severe abdominal pain, fever (temperature of 100.4 or higher), persistent vomiting, distention of your abdomen, unable to have a bowel movement for 5 days or are not passing gas, please return to the hospital.

## 2023-06-25 ENCOUNTER — Ambulatory Visit
Admission: RE | Admit: 2023-06-25 | Discharge: 2023-06-25 | Disposition: A | Discharge status: Home / Self Care | Payer: Medicare Other | Attending: Urology | Admitting: Urology

## 2023-06-25 ENCOUNTER — Ambulatory Visit: Payer: Medicare Other | Admitting: Urology

## 2023-06-25 ENCOUNTER — Ambulatory Visit
Admission: RE | Admit: 2023-06-25 | Discharge: 2023-06-25 | Disposition: A | Discharge status: Home / Self Care | Payer: Medicare Other | Source: Ambulatory Visit | Attending: Urology

## 2023-06-25 ENCOUNTER — Other Ambulatory Visit: Payer: Self-pay

## 2023-06-25 VITALS — BP 95/63 | HR 75 | Ht 73.0 in | Wt 235.0 lb

## 2023-06-25 DIAGNOSIS — N200 Calculus of kidney: Secondary | ICD-10-CM

## 2023-06-25 DIAGNOSIS — N201 Calculus of ureter: Secondary | ICD-10-CM

## 2023-06-25 DIAGNOSIS — N401 Enlarged prostate with lower urinary tract symptoms: Secondary | ICD-10-CM

## 2023-06-25 DIAGNOSIS — N179 Acute kidney failure, unspecified: Secondary | ICD-10-CM | POA: Diagnosis not present

## 2023-06-25 DIAGNOSIS — N1330 Unspecified hydronephrosis: Secondary | ICD-10-CM | POA: Diagnosis not present

## 2023-06-25 DIAGNOSIS — R350 Frequency of micturition: Secondary | ICD-10-CM

## 2023-06-25 MED ORDER — CIPROFLOXACIN HCL 250 MG PO TABS: 250.0000 mg | ORAL_TABLET | Freq: Two times a day (BID) | ORAL | 0 refills | Status: DC

## 2023-06-25 NOTE — Progress Notes (Signed)
 I,Amy L Pierron,acting as a scribe for Vanna Scotland, MD.,have documented all relevant documentation on the behalf of Vanna Scotland, MD,as directed by  Vanna Scotland, MD while in the presence of Vanna Scotland, MD.  06/25/2023 3:08 PM   Eric Hanna 11-Jul-1945 [id number]  Referring provider: Lauro Regulus, MD [address] [facility] [place],  [place] [address]  Chief Complaint  Patient presents with   Establish Care   Nephrolithiasis    HPI: 78 year-old male presents today for further evaluation of recent stone event.  He presented to the emergency room yesterday complaining of generalized abdominal pain radiating to his back, nausea, vomiting, and a low grade fever of 99.5. He had a blood count of 11.1 and a mildly suspicious urinalysis with a few WBC's and RBC's. His urine culture grew Pseudomonas. His creatinine was elevated to 1.5 from his previous baseline.   He had a CT scan that indicated an 8 mm right UVJ stone causing hydroureteronephrosis. He has other relatively small non-obstructing stones.   He mentions he has a personal history of about 10 stone events over the years since about 1970. He has success with a lithotripsy back in 2019.  Over the weekend he was constipated so he though that was the source of his pain. However, he recalls having burning with urination. He took pain medication. But today he woke up without any pain.  He states over the past couple years he has be having increased urinary frequency and urgency which he wears Depends for. He is not on any medications for this right now.  PMH: Past Medical History:  Diagnosis Date   Basal cell carcinoma 04/16/2022   L neck - Excised 05/29/22   Coronary artery disease    DDD (degenerative disc disease), cervical    Depression    Diabetes mellitus without complication (HCC)    borderline; no meds; no blood sugar checks   Diarrhea    GERD (gastroesophageal reflux disease)     Hypertension    Hypertrophy of prostate    Impotence, organic    Lower urinary tract symptoms (LUTS)    Lumbar trigger point syndrome    Malignant neoplasm of bladder (HCC)    Myocardial infarction (HCC) 2000   Obesity    OSA (obstructive sleep apnea)    CPAP   Osteoarthritis    Sleep apnea    Spondylolisthesis of cervical region    Testicular hypofunction    WPW (Wolff-Parkinson-White syndrome)     Surgical History: Past Surgical History:  Procedure Laterality Date   CARDIAC CATHETERIZATION     CATARACT EXTRACTION W/PHACO Left 06/05/2017   Procedure: CATARACT EXTRACTION PHACO AND INTRAOCULAR LENS PLACEMENT (IOC) LEFT;  Surgeon: Lockie Mola, MD;  Location: [facility];  Service: Ophthalmology;  Laterality: Left;  sleep apnea   CATARACT EXTRACTION W/PHACO Right 07/03/2017   Procedure: CATARACT EXTRACTION PHACO AND INTRAOCULAR LENS PLACEMENT (IOC) RIGHT;  Surgeon: Lockie Mola, MD;  Location: [facility];  Service: Ophthalmology;  Laterality: Right;  sleep apnea   CHIN IMPLANT      COLONOSCOPY     COLONOSCOPY WITH PROPOFOL N/A 09/07/2015   Procedure: COLONOSCOPY WITH PROPOFOL;  Surgeon: Scot Jun, MD;  Location: [facility];  Service: Endoscopy;  Laterality: N/A;   HAND SURGERY     STOMACH REDUCTION WITHOUT BYPASS     X-STOP IMPLANTATION      Home Medications:  Allergies as of 06/25/2023   No Known Allergies  Medication List        Accurate as of June 25, 2023  3:08 PM. If you have any questions, ask your nurse or doctor.          STOP taking these medications    calcium citrate-vitamin D 315-200 MG-UNIT tablet Commonly known as: CITRACAL+D Stopped by: Vanna Scotland   cetirizine 10 MG tablet Commonly known as: ZYRTEC Stopped by: Vanna Scotland   COENZYME Q10 PO Stopped by: Vanna Scotland   colchicine 0.6 MG tablet Stopped by: Vanna Scotland   docusate sodium 100 MG capsule Commonly known as:  Colace Stopped by: Vanna Scotland   ibuprofen 600 MG tablet Commonly known as: ADVIL Stopped by: Vanna Scotland   mupirocin ointment 2 % Commonly known as: BACTROBAN Stopped by: Vanna Scotland   polyethylene glycol 17 g packet Commonly known as: MiraLax Stopped by: Vanna Scotland   Semaglutide 7 MG Tabs Stopped by: Vanna Scotland   sildenafil 100 MG tablet Commonly known as: VIAGRA Stopped by: Vanna Scotland   terbinafine 250 MG tablet Commonly known as: LAMISIL Stopped by: Vanna Scotland       TAKE these medications    alendronate 70 MG tablet Commonly known as: FOSAMAX Take 70 mg by mouth once a week. Take with a full glass of water on an empty stomach.   aspirin 81 MG tablet Take 81 mg by mouth daily.   cyanocobalamin 1000 MCG tablet Take by mouth.   Fish Oil 1200 MG Cpdr Take by mouth daily.   metoprolol succinate 25 MG 24 hr tablet Commonly known as: TOPROL-XL Take 25 mg by mouth daily.   Mounjaro 7.5 MG/0.5ML Pen Generic drug: tirzepatide Inject into the skin.  Inject 0.5 mLs (7.5 mg total) subcutaneously once a week   multivitamin capsule Take 1 capsule by mouth daily.   nitroGLYCERIN 0.3 MG SL tablet Commonly known as: NITROSTAT   omeprazole 20 MG capsule Commonly known as: PRILOSEC Take 20 mg by mouth daily.   ondansetron 4 MG disintegrating tablet Commonly known as: ZOFRAN-ODT Take 1 tablet (4 mg total) by mouth every 6 (six) hours as needed for nausea or vomiting.   oxyCODONE-acetaminophen 5-325 MG tablet Commonly known as: Percocet Take 2 tablets by mouth every 8 (eight) hours as needed for severe pain (pain score 7-10).   sertraline 50 MG tablet Commonly known as: ZOLOFT Take 1 tablet (50 mg total) by mouth at bedtime.   simvastatin 80 MG tablet Commonly known as: ZOCOR Take 80 mg by mouth daily.   tamsulosin 0.4 MG Caps capsule Commonly known as: Flomax Take 1 capsule (0.4 mg total) by mouth daily.   traZODone 50 MG  tablet Commonly known as: DESYREL Take 50 mg by mouth at bedtime.        Social History:  reports that he quit smoking about 25 years ago. His smoking use included cigarettes. He has never used smokeless tobacco. He reports that he does not drink alcohol and does not use drugs.   Physical Exam: BP 95/63   Pulse 75   Ht 6\' 1"  (1.854 m)   Wt 235 lb (106.6 kg)   BMI 31.00 kg/m   Constitutional:  Alert and oriented, No acute distress. HEENT: [place] AT, moist mucus membranes.  Trachea midline, no masses. Neurologic: Grossly intact, no focal deficits, moving all 4 extremities. Psychiatric: Normal mood and affect.   Pertinent Imaging: Narrative & Impression  CLINICAL DATA:  Constipation symptoms   EXAM: CT ABDOMEN AND PELVIS WITH CONTRAST  TECHNIQUE: Multidetector CT imaging of the abdomen and pelvis was performed using the standard protocol following bolus administration of intravenous contrast.   RADIATION DOSE REDUCTION: This exam was performed according to the departmental dose-optimization program which includes automated exposure control, adjustment of the mA and/or kV according to patient size and/or use of iterative reconstruction technique.   CONTRAST:  OMNIPAQUE IOHEXOL 300 MG/ML  SOLN   COMPARISON:  02/15/2023   FINDINGS: Lower chest: No acute abnormality.   Hepatobiliary: No focal liver abnormality is seen. No gallstones, gallbladder wall thickening, or biliary dilatation.   Pancreas: Unremarkable. No pancreatic ductal dilatation or surrounding inflammatory changes.   Spleen: Normal in size without focal abnormality.   Adrenals/Urinary Tract: Adrenal glands are within normal limits. Left kidney demonstrates few small nonobstructing calculi measuring up to 3 mm. Simple renal cyst is noted. No follow-up is recommended. The left ureter is the right kidney also demonstrates simple cysts. No follow-up is recommended. Significant hydronephrosis  and hydroureter is identified secondary to a 8 mm calculus at the right UVJ. A few tiny nonobstructing stones are noted in the right kidney. Bladder is decompressed.   Stomach/Bowel: No obstructive or inflammatory changes of the colon are noted. Diverticular changes noted without diverticulitis. Appendix is not well visualized and may have been surgically removed. Correlate with clinical history. No inflammatory changes are seen. Small bowel is within normal limits. Surgical changes in the stomach are again noted.   Vascular/Lymphatic: Aortic atherosclerosis. No enlarged abdominal or pelvic lymph nodes.   Reproductive: Prostate is unremarkable.   Other: No abdominal wall hernia or abnormality. No abdominopelvic ascites.   Musculoskeletal: No acute or significant osseous findings.   IMPRESSION: 8 mm right UVJ stone causing hydronephrosis.   Bilateral small nonobstructing renal calculi.   Scattered diverticular change without diverticulitis.   Electronically Signed   By: Alcide Clever M.D.   On: 06/24/2023 03:59  Personally reviewed the above scan and agree with final radiologic interpretation.  KUB performed today, personally reviewed image, and awaiting final radiologic interpretation. 1,100 hounsfield units.   Assessment & Plan:    1. Right UVJ stone  - Start taking Flomax. Also prescribed Cipro 250 BID for 7 days.   - Will likely pass on his own and will be visible once he does because of the size. Will get another KUB in 2 weeks to evaluate this. Gave him a strainer to take home.  2. Bilateral hydroureteronephrosis  - As a result of number 1.   3. Acute kidney injury  - Likely secondary to number 1. Should resolve with interval passage of the stone and hydration. Will titrate his anti-biotic for this.   - Will recheck his kidney function when he returns to help evaluate if he passed the stone.  4. BPH with urinary frequency  - Starting on Flomax to aid with the  stone. He will take note if this aids with his urinary issues as well.   No follow-ups on file.   [facility] [address] [place], [place] [address] [phone]

## 2023-06-26 LAB — URINE CULTURE: Culture: >=100000 — AB

## 2023-06-28 ENCOUNTER — Encounter: Payer: Self-pay | Admitting: Urology

## 2023-07-02 ENCOUNTER — Other Ambulatory Visit: Payer: Self-pay

## 2023-07-02 DIAGNOSIS — N179 Acute kidney failure, unspecified: Secondary | ICD-10-CM

## 2023-07-02 DIAGNOSIS — N401 Enlarged prostate with lower urinary tract symptoms: Secondary | ICD-10-CM

## 2023-07-02 DIAGNOSIS — N200 Calculus of kidney: Secondary | ICD-10-CM

## 2023-07-02 DIAGNOSIS — N1330 Unspecified hydronephrosis: Secondary | ICD-10-CM

## 2023-07-08 ENCOUNTER — Other Ambulatory Visit: Payer: Medicare Other

## 2023-07-08 NOTE — Progress Notes (Signed)
 07/09/2023 8:45 PM   Moise Boring Tawana Scale January 19, 1946 [id number]  Referring provider: Lauro Regulus, MD [address] [facility] [address] [facility] [address] [place],  [place] [address]  Urological history: 1.  Hypogonadism -Continue factors of age, obesity and sleep apnea. -Not currently on TRT  2.  TA low-grade urothelial carcinoma -TURBT (2010) -Surveillance cystoscopy in 2019, negative -Surveillance has been discontinued  3. BPH with LU TS   Chief Complaint  Patient presents with   Urinary urgent   HPI: Eric Hanna is a 78 y.o. male who presents today for kidney stone.   Previous records reviewed.   UA yellow clear, specific gravity less than 1.005, pH 5.5, 0-5 WBCs, 0-10 epithelial cells, hyaline cast present, mucus threads present and a few bacteria.  KUB stone not visualized.    I PSS 6/3  PVR 16 mL   He has passed the stone since his last visit with [place].  His issue today is urinary frequency and urgency which has been occurring for the last year.  Patient denies any modifying or aggravating factors.  Patient denies any recent UTI's, gross hematuria, dysuria or suprapubic/flank pain.  Patient denies any fevers, chills, nausea or vomiting.     IPSS     Row Name 07/09/23 1400         International Prostate Symptom Score   How often have you had to urinate less than every two hours? Less than 1 in 5 times     How often have you found you stopped and started again several times when you urinated? Not at All     How often have you found it difficult to postpone urination? More than half the time     How often have you had a weak urinary stream? Not at All     How often have you had to strain to start urination? Not at All     How many times did you typically get up at night to urinate? 1 Time     Total IPSS Score 6       Quality of Life due to urinary symptoms   If you were to spend the rest of your life with your urinary condition just the way it is now how  would you feel about that? Mixed              Score:  1-7 Mild 8-19 Moderate 20-35 Severe     PMH: Past Medical History:  Diagnosis Date   Basal cell carcinoma 04/16/2022   L neck - Excised 05/29/22   Coronary artery disease    DDD (degenerative disc disease), cervical    Depression    Diabetes mellitus without complication (HCC)    borderline; no meds; no blood sugar checks   Diarrhea    GERD (gastroesophageal reflux disease)    Hypertension    Hypertrophy of prostate    Impotence, organic    Lower urinary tract symptoms (LUTS)    Lumbar trigger point syndrome    Malignant neoplasm of bladder (HCC)    Myocardial infarction (HCC) 2000   Obesity    OSA (obstructive sleep apnea)    CPAP   Osteoarthritis    Sleep apnea    Spondylolisthesis of cervical region    Testicular hypofunction    WPW (Wolff-Parkinson-White syndrome)     Surgical History: Past Surgical History:  Procedure Laterality Date   CARDIAC CATHETERIZATION     CATARACT EXTRACTION W/PHACO Left 06/05/2017  Procedure: CATARACT EXTRACTION PHACO AND INTRAOCULAR LENS PLACEMENT (IOC) LEFT;  Surgeon: Lockie Mola, MD;  Location: [facility];  Service: Ophthalmology;  Laterality: Left;  sleep apnea   CATARACT EXTRACTION W/PHACO Right 07/03/2017   Procedure: CATARACT EXTRACTION PHACO AND INTRAOCULAR LENS PLACEMENT (IOC) RIGHT;  Surgeon: Lockie Mola, MD;  Location: [facility];  Service: Ophthalmology;  Laterality: Right;  sleep apnea   CHIN IMPLANT      COLONOSCOPY     COLONOSCOPY WITH PROPOFOL N/A 09/07/2015   Procedure: COLONOSCOPY WITH PROPOFOL;  Surgeon: Scot Jun, MD;  Location: [facility];  Service: Endoscopy;  Laterality: N/A;   HAND SURGERY     STOMACH REDUCTION WITHOUT BYPASS     X-STOP IMPLANTATION      Home Medications:  Allergies as of 07/09/2023   No Known Allergies      Medication List        Accurate as of July 09, 2023 11:59 PM. If you have  any questions, ask your nurse or doctor.          STOP taking these medications    ciprofloxacin 250 MG tablet Commonly known as: Cipro Stopped by: Bronx Brogden   nitroGLYCERIN 0.3 MG SL tablet Commonly known as: NITROSTAT Stopped by: Anuoluwapo Mefferd   oxyCODONE-acetaminophen 5-325 MG tablet Commonly known as: Percocet Stopped by: Michiel Cowboy   tamsulosin 0.4 MG Caps capsule Commonly known as: Flomax Stopped by: Kaniyah Lisby       TAKE these medications    alendronate 70 MG tablet Commonly known as: FOSAMAX Take 70 mg by mouth once a week. Take with a full glass of water on an empty stomach.   Allergy Relief 10 MG tablet Generic drug: loratadine Take 10 mg by mouth daily.   aspirin 81 MG tablet Take 81 mg by mouth daily.   CALCIUM MAGNESIUM 750 PO Take by mouth.   cyanocobalamin 1000 MCG tablet Take by mouth.   DSS 100 MG Caps Take by mouth.   Fish Oil 1200 MG Cpdr Take by mouth daily.   metoprolol succinate 25 MG 24 hr tablet Commonly known as: TOPROL-XL Take 25 mg by mouth daily.   Mounjaro 7.5 MG/0.5ML Pen Generic drug: tirzepatide Inject into the skin.  Inject 0.5 mLs (7.5 mg total) subcutaneously once a week   multivitamin capsule Take 1 capsule by mouth daily.   omeprazole 20 MG capsule Commonly known as: PRILOSEC Take 20 mg by mouth daily.   ondansetron 4 MG disintegrating tablet Commonly known as: ZOFRAN-ODT Take 1 tablet (4 mg total) by mouth every 6 (six) hours as needed for nausea or vomiting.   sertraline 50 MG tablet Commonly known as: ZOLOFT Take 1 tablet (50 mg total) by mouth at bedtime.   simvastatin 80 MG tablet Commonly known as: ZOCOR Take 80 mg by mouth daily.   traZODone 50 MG tablet Commonly known as: DESYREL Take 50 mg by mouth at bedtime.        Allergies: No Known Allergies  Family History: No family history on file.  Social History:  reports that he quit smoking about 25 years ago. His  smoking use included cigarettes. He has never used smokeless tobacco. He reports that he does not drink alcohol and does not use drugs.  ROS: Pertinent ROS in HPI  Physical Exam: BP 96/64   Pulse 86   Ht 6\' 1"  (1.854 m)   Wt 225 lb (102.1 kg)   BMI 29.69 kg/m   Constitutional:  Well  nourished. Alert and oriented, No acute distress. HEENT: [place] AT, moist mucus membranes.  Trachea midline Cardiovascular: No clubbing, cyanosis, or edema. Respiratory: Normal respiratory effort, no increased work of breathing. Neurologic: Grossly intact, no focal deficits, moving all 4 extremities. Psychiatric: Normal mood and affect.  Laboratory Data: Lab Results  Component Value Date   WBC 11.1 (H) 06/24/2023   HGB 14.0 06/24/2023   HCT 41.9 06/24/2023   MCV 89.7 06/24/2023   PLT 149 (L) 06/24/2023    Lab Results  Component Value Date   CREATININE 1.00 07/09/2023    Lab Results  Component Value Date   AST 41 06/24/2023   Lab Results  Component Value Date   ALT 21 06/24/2023   Urinalysis See EPIC and HPI  I have reviewed the labs.   Pertinent Imaging: KUB no stone seen, radiologist interpretation still pending.  I have independently reviewed the films.    Assessment & Plan:    1. Right UVJ stone -KUB no stone seen -UA no micro heme -Patient brought in the stone that he had passed, but he did not want it sent for analysis  2. BPH with LUTS -most bothersome symptoms are urgency and frequency, he has an upcoming surgery for knee replacement at the end of April and would like to defer any treatment for his symptoms until after his knee is replaced -continue conservative management, avoiding bladder irritants and timed voiding's -Continue tamsulosin 0.4 mg daily  3. AKI -repeat BMP pending   4. Bladder cancer -Isolated LgTaTCC 2010  Return for Keep follow up with Dr. Apolinar Junes in February .  These notes generated with voice recognition software. I apologize for typographical  errors.  Cloretta Ned  [facility] [address]  [address] [place], [place] [address] [phone]

## 2023-07-09 ENCOUNTER — Ambulatory Visit
Admission: RE | Admit: 2023-07-09 | Discharge: 2023-07-09 | Disposition: A | Discharge status: Home / Self Care | Source: Ambulatory Visit | Attending: Urology | Admitting: Urology

## 2023-07-09 ENCOUNTER — Other Ambulatory Visit: Payer: Medicare Other

## 2023-07-09 ENCOUNTER — Ambulatory Visit: Payer: Medicare Other | Admitting: Urology

## 2023-07-09 ENCOUNTER — Encounter: Payer: Self-pay | Admitting: Urology

## 2023-07-09 ENCOUNTER — Ambulatory Visit (INDEPENDENT_AMBULATORY_CARE_PROVIDER_SITE_OTHER): Payer: Medicare Other | Admitting: Urology

## 2023-07-09 ENCOUNTER — Ambulatory Visit
Admission: RE | Admit: 2023-07-09 | Discharge: 2023-07-09 | Disposition: A | Discharge status: Home / Self Care | Attending: Urology | Admitting: Urology

## 2023-07-09 VITALS — BP 96/64 | HR 86 | Ht 73.0 in | Wt 225.0 lb

## 2023-07-09 DIAGNOSIS — N401 Enlarged prostate with lower urinary tract symptoms: Secondary | ICD-10-CM

## 2023-07-09 DIAGNOSIS — N200 Calculus of kidney: Secondary | ICD-10-CM | POA: Insufficient documentation

## 2023-07-09 DIAGNOSIS — N179 Acute kidney failure, unspecified: Secondary | ICD-10-CM | POA: Diagnosis not present

## 2023-07-09 DIAGNOSIS — C679 Malignant neoplasm of bladder, unspecified: Secondary | ICD-10-CM

## 2023-07-09 DIAGNOSIS — R350 Frequency of micturition: Secondary | ICD-10-CM

## 2023-07-09 DIAGNOSIS — N1330 Unspecified hydronephrosis: Secondary | ICD-10-CM

## 2023-07-09 LAB — URINALYSIS, COMPLETE
Bilirubin, UA: NEGATIVE
Glucose, UA: NEGATIVE
Ketones, UA: NEGATIVE
Leukocytes,UA: NEGATIVE
Nitrite, UA: NEGATIVE
Protein,UA: NEGATIVE
RBC, UA: NEGATIVE
Specific Gravity, UA: <1.005 — ABNORMAL LOW (ref 1.005–1.030)
Urobilinogen, Ur: 0.2 mg/dL (ref 0.2–1.0)
pH, UA: 5.5 (ref 5.0–7.5)

## 2023-07-09 LAB — BLADDER SCAN AMB NON-IMAGING

## 2023-07-09 LAB — MICROSCOPIC EXAMINATION: RBC, Urine: NONE SEEN /HPF (ref 0–2)

## 2023-07-10 LAB — BASIC METABOLIC PANEL
BUN/Creatinine Ratio: 21 (ref 10–24)
BUN: 21 mg/dL (ref 8–27)
CO2: 22 mmol/L (ref 20–29)
Calcium: 9.6 mg/dL (ref 8.6–10.2)
Chloride: 103 mmol/L (ref 96–106)
Creatinine, Ser: 1 mg/dL (ref 0.76–1.27)
Glucose: 102 mg/dL — ABNORMAL HIGH (ref 70–99)
Potassium: 4.6 mmol/L (ref 3.5–5.2)
Sodium: 141 mmol/L (ref 134–144)
eGFR: 77 mL/min/{1.73_m2} (ref >59)

## 2023-07-11 ENCOUNTER — Other Ambulatory Visit: Payer: Self-pay | Admitting: Internal Medicine

## 2023-07-11 DIAGNOSIS — R911 Solitary pulmonary nodule: Secondary | ICD-10-CM

## 2023-07-11 DIAGNOSIS — I7121 Aneurysm of the ascending aorta, without rupture: Secondary | ICD-10-CM

## 2023-07-25 ENCOUNTER — Ambulatory Visit
Admission: RE | Admit: 2023-07-25 | Discharge: 2023-07-25 | Disposition: A | Discharge status: Home / Self Care | Source: Ambulatory Visit | Attending: Internal Medicine | Admitting: Internal Medicine

## 2023-07-25 DIAGNOSIS — I7121 Aneurysm of the ascending aorta, without rupture: Secondary | ICD-10-CM | POA: Diagnosis present

## 2023-07-25 DIAGNOSIS — R911 Solitary pulmonary nodule: Secondary | ICD-10-CM | POA: Diagnosis present

## 2023-08-06 NOTE — H&P (Signed)
 TOTAL KNEE ADMISSION H&P  Patient is being admitted for right total knee arthroplasty.  Subjective:  Chief Complaint: Right knee pain.  HPI: Eric Hanna, 78 y.o. male has a history of pain and functional disability in the right knee due to arthritis and has failed non-surgical conservative treatments for greater than 12 weeks to include NSAID's and/or analgesics, corticosteriod injections, and activity modification. Onset of symptoms was gradual, starting  several  years ago with gradually worsening course since that time. The patient noted no past surgery on the right knee.  Patient currently rates pain in the right knee at 8 out of 10 with activity. Patient has night pain, worsening of pain with activity and weight bearing, pain with passive range of motion, and crepitus. Patient has evidence of  bone on bone arthritis  by imaging studies.  There is no active infection.  Patient Active Problem List   Diagnosis Date Noted   Abnormal cardiovascular stress test 03/31/2020   Thoracic aortic aneurysm without rupture (HCC) 01/04/2020   Pericardial effusion 01/01/2020   Pain of left hip joint 10/03/2018   Healthcare maintenance 06/25/2017   Primary osteoarthritis of right hip 05/25/2015   DDD (degenerative disc disease), cervical 02/22/2015   Major depression in remission (HCC) 02/15/2014   History of depression 02/15/2014   CAD (coronary artery disease), native coronary artery 01/31/2014   Obesity, unspecified 01/31/2014   Obstructive sleep apnea (adult) (pediatric) 01/31/2014   Benign hypertension 01/31/2014   Myocardial infarction (HCC) 01/07/2014   Pre-excitation syndrome 01/07/2014   Hypertrophy of prostate without urinary obstruction and other lower urinary tract symptoms (LUTS) 03/26/2012   Hypogonadism, male 03/26/2012   History of bladder cancer 03/26/2012   Acquired cyst of kidney 03/26/2012    Past Medical History:  Diagnosis Date   Basal cell carcinoma 04/16/2022   L  neck - Excised 05/29/22   Coronary artery disease    DDD (degenerative disc disease), cervical    Depression    Diabetes mellitus without complication (HCC)    borderline; no meds; no blood sugar checks   Diarrhea    GERD (gastroesophageal reflux disease)    Hypertension    Hypertrophy of prostate    Impotence, organic    Lower urinary tract symptoms (LUTS)    Lumbar trigger point syndrome    Malignant neoplasm of bladder (HCC)    Myocardial infarction (HCC) 2000   Obesity    OSA (obstructive sleep apnea)    CPAP   Osteoarthritis    Sleep apnea    Spondylolisthesis of cervical region    Testicular hypofunction    WPW (Wolff-Parkinson-White syndrome)     Past Surgical History:  Procedure Laterality Date   CARDIAC CATHETERIZATION     CATARACT EXTRACTION W/PHACO Left 06/05/2017   Procedure: CATARACT EXTRACTION PHACO AND INTRAOCULAR LENS PLACEMENT (IOC) LEFT;  Surgeon: Lockie Mola, MD;  Location: [facility];  Service: Ophthalmology;  Laterality: Left;  sleep apnea   CATARACT EXTRACTION W/PHACO Right 07/03/2017   Procedure: CATARACT EXTRACTION PHACO AND INTRAOCULAR LENS PLACEMENT (IOC) RIGHT;  Surgeon: Lockie Mola, MD;  Location: [facility];  Service: Ophthalmology;  Laterality: Right;  sleep apnea   CHIN IMPLANT      COLONOSCOPY     COLONOSCOPY WITH PROPOFOL N/A 09/07/2015   Procedure: COLONOSCOPY WITH PROPOFOL;  Surgeon: Scot Jun, MD;  Location: [facility];  Service: Endoscopy;  Laterality: N/A;   HAND SURGERY     STOMACH REDUCTION WITHOUT BYPASS  X-STOP IMPLANTATION      Prior to Admission medications   Medication Sig Start Date End Date Taking? Authorizing Provider  alendronate (FOSAMAX) 70 MG tablet Take 70 mg by mouth once a week. Take with a full glass of water on an empty stomach.    [provider]  aspirin 81 MG tablet Take 81 mg by mouth daily.    [provider]  CALCIUM MAGNESIUM 750 PO Take by  mouth.    [provider]  cyanocobalamin 1000 MCG tablet Take by mouth.    [provider]  Docusate Sodium (DSS) 100 MG CAPS Take by mouth. 06/24/23   [provider]  loratadine (ALLERGY RELIEF) 10 MG tablet Take 10 mg by mouth daily.    [provider]  metoprolol succinate (TOPROL-XL) 25 MG 24 hr tablet Take 25 mg by mouth daily. 11/12/19   [provider]  MOUNJARO 7.5 MG/0.5ML Pen Inject into the skin.  Inject 0.5 mLs (7.5 mg total) subcutaneously once a week 06/20/23   [provider]  Multiple Vitamin (MULTIVITAMIN) capsule Take 1 capsule by mouth daily.    [provider]  Omega-3 Fatty Acids (FISH OIL) 1200 MG CPDR Take by mouth daily.    [provider]  omeprazole (PRILOSEC) 20 MG capsule Take 20 mg by mouth daily.    [provider]  ondansetron (ZOFRAN-ODT) 4 MG disintegrating tablet Take 1 tablet (4 mg total) by mouth every 6 (six) hours as needed for nausea or vomiting. 06/24/23   Ward, Layla Maw, DO  sertraline (ZOLOFT) 50 MG tablet Take 1 tablet (50 mg total) by mouth at bedtime. 02/23/22 05/24/22  Neysa Hotter, MD  simvastatin (ZOCOR) 80 MG tablet Take 80 mg by mouth daily.    [provider]  traZODone (DESYREL) 50 MG tablet Take 50 mg by mouth at bedtime. 11/12/16   [provider]    No Known Allergies  Social History   Socioeconomic History   Marital status: Married    Spouse name: donna   Number of children: 1   Years of education: Not on file   Highest education level: Some college, no degree  Occupational History   Not on file  Tobacco Use   Smoking status: Former    Current packs/day: 0.00    Types: Cigarettes    Quit date: 2000    Years since quitting: 25.2   Smokeless tobacco: Never  Vaping Use   Vaping status: Never Used  Substance and Sexual Activity   Alcohol use: No   Drug use: No   Sexual activity: Yes  Other Topics Concern   Not on file  Social  History Narrative   Not on file   Social Drivers of Health   Financial Resource Strain: Low Risk  (06/14/2023)   Received from [facility]   Overall Financial Resource Strain (CARDIA)    Difficulty of Paying Living Expenses: Not hard at all  Food Insecurity: No Food Insecurity (06/14/2023)   Received from [facility]   Hunger Vital Sign    Worried About Running Out of Food in the Last Year: Never true    Ran Out of Food in the Last Year: Never true  Transportation Needs: No Transportation Needs (06/14/2023)   Received from [facility] - [facility]    In the past 12 months, has lack of transportation kept you from medical appointments or from getting medications?: No  Lack of Transportation (Non-Medical): No  Physical Activity: Not on file  Stress: Not on file  Social Connections: Not on file  Intimate Partner Violence: Not on file    Tobacco Use: Medium Risk (07/18/2023)   Received from [facility]   Patient History    Smoking Tobacco Use: Former    Smokeless Tobacco Use: Never    Passive Exposure: Not on file   Social History   Substance and Sexual Activity  Alcohol Use No    No family history on file.  Review of Systems  Constitutional:  Negative for chills and fever.  HENT:  Negative for congestion, sore throat and tinnitus.   Eyes:  Negative for double vision, photophobia and pain.  Respiratory:  Negative for cough, shortness of breath and wheezing.   Cardiovascular:  Negative for chest pain, palpitations and orthopnea.  Gastrointestinal:  Negative for heartburn, nausea and vomiting.  Genitourinary:  Negative for dysuria, frequency and urgency.  Musculoskeletal:  Positive for joint pain.  Neurological:  Negative for dizziness, weakness and headaches.    Objective:  Physical Exam: Well nourished and well developed.  General: Alert and oriented x3, cooperative and pleasant, no  acute distress.  Head: normocephalic, atraumatic, neck supple.  Eyes: EOMI.  Musculoskeletal:   Right knee shows no effusion.  - Range of motion right knee is 0 to 130 with crepitus on range of motion.  - Knee is very tender in the medial joint line.  - Slight lateral tenderness but no instability noted.  Calves soft and nontender. Motor function intact in LE. Strength 5/5 LE bilaterally. Neuro: Distal pulses 2+. Sensation to light touch intact in LE.  Imaging Review Plain radiographs demonstrate severe degenerative joint disease of the right knee. The overall alignment is neutral. The bone quality appears to be adequate for age and reported activity level.  Assessment/Plan:  End stage arthritis, right knee   The patient history, physical examination, clinical judgment of the provider and imaging studies are consistent with end stage degenerative joint disease of the right knee and total knee arthroplasty is deemed medically necessary. The treatment options including medical management, injection therapy arthroscopy and arthroplasty were discussed at length. The risks and benefits of total knee arthroplasty were presented and reviewed. The risks due to aseptic loosening, infection, stiffness, patella tracking problems, thromboembolic complications and other imponderables were discussed. The patient acknowledged the explanation, agreed to proceed with the plan and consent was signed. Patient is being admitted for inpatient treatment for surgery, pain control, PT, OT, prophylactic antibiotics, VTE prophylaxis, progressive ambulation and ADLs and discharge planning. The patient is planning to be discharged  home .  Patient's anticipated LOS is less than 2 midnights, meeting these requirements: - Lives within 1 hour of care - Has a competent adult at home to recover with post-op recover - NO history of  - Chronic pain requiring opioids  - Diabetes  - Coronary Artery Disease  - Heart  failure  - Heart attack  - Stroke  - DVT/VTE  - Cardiac arrhythmia  - Respiratory Failure/COPD  - Renal failure  - Anemia  - Advanced Liver disease    Therapy Plans: Outpatient therapy at [facility]) Disposition: Home with wife Planned DVT Prophylaxis: Xarelto 10 mg QD DME Needed: None PCP: Einar Crow, MD (clearance received) TXA: IV Allergies: NKDA Metal Allergy: None Anesthesia Concerns: Hx spinal stenosis BMI: 30.7 Last HgbA1c: 5.8% (07/2023) Pain Regimen: Oxycodone Pharmacy: [facility] [address])  Other: - ASA daily,  hx of gastric bypass  - Patient was instructed on what medications to stop prior to surgery. - Follow-up visit in 2 weeks with Dr. Lequita Halt - Begin physical therapy following surgery - Pre-operative lab work as pre-surgical testing - Prescriptions will be provided in hospital at time of discharge  Arther Abbott, PA-C Orthopedic Surgery [facility]

## 2023-08-26 NOTE — Patient Instructions (Signed)
 SURGICAL WAITING ROOM VISITATION  Patients having surgery or a procedure may have no more than 2 support people in the waiting area - these visitors may rotate.    Children under the age of 73 must have an adult with them who is not the patient.  Due to an increase in RSV and influenza rates and associated hospitalizations, children ages 72 and under may not visit patients in [facility] hospitals.  Visitors with respiratory illnesses are discouraged from visiting and should remain at home.  If the patient needs to stay at the hospital during part of their recovery, the visitor guidelines for inpatient rooms apply. Pre-op nurse will coordinate an appropriate time for 1 support person to accompany patient in pre-op.  This support person may not rotate.    Please refer to the [facility] website for the visitor guidelines for Inpatients (after your surgery is over and you are in a regular room).    Your procedure is scheduled on: 09/02/23   Report to [facility] Main Entrance    Report to admitting at 8:00 AM   Call this number if you have problems the morning of surgery [phone]   Do not eat food :After Midnight.   After Midnight you may have the following liquids until 7:30 AM DAY OF SURGERY  Water Non-Citrus Juices (without pulp, NO RED-Apple, White grape, White cranberry) Black Coffee (NO MILK/CREAM OR CREAMERS, sugar ok)  Clear Tea (NO MILK/CREAM OR CREAMERS, sugar ok) regular and decaf                             Plain Jell-O (NO RED)                                           Fruit ices (not with fruit pulp, NO RED)                                     Popsicles (NO RED)                                                               Sports drinks like Gatorade (NO RED)     The day of surgery:  Drink ONE (1) Pre-Surgery G2 at 7:30 AM the morning of surgery. Drink in one sitting. Do not sip.  This drink was given to you during your hospital  pre-op appointment  visit. Nothing else to drink after completing the  Pre-Surgery G2.          If you have questions, please contact your surgeon's office.   FOLLOW BOWEL PREP AND ANY ADDITIONAL PRE OP INSTRUCTIONS YOU RECEIVED FROM YOUR SURGEON'S OFFICE!!!     Oral Hygiene is also important to reduce your risk of infection.                                    Remember - BRUSH YOUR TEETH THE MORNING OF SURGERY WITH YOUR REGULAR TOOTHPASTE  DENTURES WILL  BE REMOVED PRIOR TO SURGERY PLEASE DO NOT APPLY "Poly grip" OR ADHESIVES!!!   Stop all vitamins and herbal supplements 7 days before surgery.   Take these medicines the morning of surgery with A SIP OF WATER: None  DO NOT TAKE ANY ORAL DIABETIC MEDICATIONS DAY OF YOUR SURGERY  How to Manage Your Diabetes Before and After Surgery  Why is it important to control my blood sugar before and after surgery? Improving blood sugar levels before and after surgery helps healing and can limit problems. A way of improving blood sugar control is eating a healthy diet by:  Eating less sugar and carbohydrates  Increasing activity/exercise  Talking with your doctor about reaching your blood sugar goals High blood sugars (greater than 180 mg/dL) can raise your risk of infections and slow your recovery, so you will need to focus on controlling your diabetes during the weeks before surgery. Make sure that the doctor who takes care of your diabetes knows about your planned surgery including the date and location.  How do I manage my blood sugar before surgery? Check your blood sugar at least 4 times a day, starting 2 days before surgery, to make sure that the level is not too high or low. Check your blood sugar the morning of your surgery when you wake up and every 2 hours until you get to the Short Stay unit. If your blood sugar is less than 70 mg/dL, you will need to treat for low blood sugar: Do not take insulin. Treat a low blood sugar (less than 70 mg/dL) with   cup of clear juice (cranberry or apple), 4 glucose tablets, OR glucose gel. Recheck blood sugar in 15 minutes after treatment (to make sure it is greater than 70 mg/dL). If your blood sugar is not greater than 70 mg/dL on recheck, call [phone] for further instructions. Report your blood sugar to the short stay nurse when you get to Short Stay.  If you are admitted to the hospital after surgery: Your blood sugar will be checked by the staff and you will probably be given insulin after surgery (instead of oral diabetes medicines) to make sure you have good blood sugar levels. The goal for blood sugar control after surgery is 80-180 mg/dL.   WHAT DO I DO ABOUT MY DIABETES MEDICATION?  Do not take oral diabetes medicines (pills) the morning of surgery.  DO NOT TAKE THE FOLLOWING 7 DAYS PRIOR TO SURGERY: Ozempic, Wegovy, Rybelsus (Semaglutide), Byetta (exenatide), Bydureon (exenatide ER), Victoza, Saxenda (liraglutide), or Trulicity (dulaglutide) Mounjaro (Tirzepatide) Adlyxin (Lixisenatide), Polyethylene Glycol Loxenatide.  Reviewed and Endorsed by [facility] Patient Education Committee, August 2015  Bring CPAP mask and tubing day of surgery.                              You may not have any metal on your body including jewelry, and body piercing             Do not wear lotions, powders, cologne, or deodorant              Men may shave face and neck.   Do not bring valuables to the hospital. [facility] IS NOT             RESPONSIBLE   FOR VALUABLES.   Contacts, glasses, dentures or bridgework may not be worn into surgery.   Bring small overnight bag day of surgery.   DO  NOT BRING YOUR HOME MEDICATIONS TO THE HOSPITAL. PHARMACY WILL DISPENSE MEDICATIONS LISTED ON YOUR MEDICATION LIST TO YOU DURING YOUR ADMISSION IN THE HOSPITAL!   Special Instructions: Bring a copy of your healthcare power of attorney and living will documents the day of surgery if you haven't scanned them  before.              Please read over the following fact sheets you were given: IF YOU HAVE QUESTIONS ABOUT YOUR PRE-OP INSTRUCTIONS PLEASE CALL [phone] Hanna   If you received a COVID test during your pre-op visit  it is requested that you wear a mask when out in public, stay away from anyone that may not be feeling well and notify your surgeon if you develop symptoms. If you test positive for Covid or have been in contact with anyone that has tested positive in the last 10 days please notify you surgeon.      Pre-operative 5 CHG Bath Instructions   You can play a key role in reducing the risk of infection after surgery. Your skin needs to be as free of germs as possible. You can reduce the number of germs on your skin by washing with CHG (chlorhexidine gluconate) soap before surgery. CHG is an antiseptic soap that kills germs and continues to kill germs even after washing.   DO NOT use if you have an allergy to chlorhexidine/CHG or antibacterial soaps. If your skin becomes reddened or irritated, stop using the CHG and notify one of our RNs at [phone].   Please shower with the CHG soap starting 4 days before surgery using the following schedule:     Please keep in mind the following:  DO NOT shave, including legs and underarms, starting the day of your first shower.   You may shave your face at any point before/day of surgery.  Place clean sheets on your bed the day you start using CHG soap. Use a clean washcloth (not used since being washed) for each shower. DO NOT sleep with pets once you start using the CHG.   CHG Shower Instructions:  If you choose to wash your hair and private area, wash first with your normal shampoo/soap.  After you use shampoo/soap, rinse your hair and body thoroughly to remove shampoo/soap residue.  Turn the water OFF and apply about 3 tablespoons (45 ml) of CHG soap to a CLEAN washcloth.  Apply CHG soap ONLY FROM YOUR NECK DOWN TO YOUR TOES  (washing for 3-5 minutes)  DO NOT use CHG soap on face, private areas, open wounds, or sores.  Pay special attention to the area where your surgery is being performed.  If you are having back surgery, having someone wash your back for you may be helpful. Wait 2 minutes after CHG soap is applied, then you may rinse off the CHG soap.  Pat dry with a clean towel  Put on clean clothes/pajamas   If you choose to wear lotion, please use ONLY the CHG-compatible lotions on the back of this paper.     Additional instructions for the day of surgery: DO NOT APPLY any lotions, deodorants, cologne, or perfumes.   Put on clean/comfortable clothes.  Brush your teeth.  Ask your nurse before applying any prescription medications to the skin.      CHG Compatible Lotions   Aveeno Moisturizing lotion  Cetaphil Moisturizing Cream  Cetaphil Moisturizing Lotion  Clairol Herbal Essence Moisturizing Lotion, Dry Skin  Clairol Herbal Essence Moisturizing Lotion, Extra Dry Skin  Clairol Herbal Essence Moisturizing Lotion, Normal Skin  Curel Age Defying Therapeutic Moisturizing Lotion with Alpha Hydroxy  Curel Extreme Care Body Lotion  Curel Soothing Hands Moisturizing Hand Lotion  Curel Therapeutic Moisturizing Cream, Fragrance-Free  Curel Therapeutic Moisturizing Lotion, Fragrance-Free  Curel Therapeutic Moisturizing Lotion, Original Formula  Eucerin Daily Replenishing Lotion  Eucerin Dry Skin Therapy Plus Alpha Hydroxy Crme  Eucerin Dry Skin Therapy Plus Alpha Hydroxy Lotion  Eucerin Original Crme  Eucerin Original Lotion  Eucerin Plus Crme Eucerin Plus Lotion  Eucerin TriLipid Replenishing Lotion  Keri Anti-Bacterial Hand Lotion  Keri Deep Conditioning Original Lotion Dry Skin Formula Softly Scented  Keri Deep Conditioning Original Lotion, Fragrance Free Sensitive Skin Formula  Keri Lotion Fast Absorbing Fragrance Free Sensitive Skin Formula  Keri Lotion Fast Absorbing Softly Scented Dry Skin  Formula  Keri Original Lotion  Keri Skin Renewal Lotion Keri Silky Smooth Lotion  Keri Silky Smooth Sensitive Skin Lotion  Nivea Body Creamy Conditioning Oil  Nivea Body Extra Enriched Lotion  Nivea Body Original Lotion  Nivea Body Sheer Moisturizing Lotion Nivea Crme  Nivea Skin Firming Lotion  NutraDerm 30 Skin Lotion  NutraDerm Skin Lotion  NutraDerm Therapeutic Skin Cream  NutraDerm Therapeutic Skin Lotion  ProShield Protective Hand Cream  Provon moisturizing lotion   Incentive Spirometer  An incentive spirometer is a tool that can help keep your lungs clear and active. This tool measures how well you are filling your lungs with each breath. Taking long deep breaths may help reverse or decrease the chance of developing breathing (pulmonary) problems (especially infection) following: A long period of time when you are unable to move or be active. BEFORE THE PROCEDURE  If the spirometer includes an indicator to show your best effort, your nurse or respiratory therapist will set it to a desired goal. If possible, sit up straight or lean slightly forward. Try not to slouch. Hold the incentive spirometer in an upright position. INSTRUCTIONS FOR USE  Sit on the edge of your bed if possible, or sit up as far as you can in bed or on a chair. Hold the incentive spirometer in an upright position. Breathe out normally. Place the mouthpiece in your mouth and seal your lips tightly around it. Breathe in slowly and as deeply as possible, raising the piston or the ball toward the top of the column. Hold your breath for 3-5 seconds or for as long as possible. Allow the piston or ball to fall to the bottom of the column. Remove the mouthpiece from your mouth and breathe out normally. Rest for a few seconds and repeat Steps 1 through 7 at least 10 times every 1-2 hours when you are awake. Take your time and take a few normal breaths between deep breaths. The spirometer may include an indicator to  show your best effort. Use the indicator as a goal to work toward during each repetition. After each set of 10 deep breaths, practice coughing to be sure your lungs are clear. If you have an incision (the cut made at the time of surgery), support your incision when coughing by placing a pillow or rolled up towels firmly against it. Once you are able to get out of bed, walk around indoors and cough well. You may stop using the incentive spirometer when instructed by your caregiver.  RISKS AND COMPLICATIONS Take your time so you do not get dizzy or light-headed. If you are in pain, you may need to take or ask for pain medication before doing  incentive spirometry. It is harder to take a deep breath if you are having pain. AFTER USE Rest and breathe slowly and easily. It can be helpful to keep track of a log of your progress. Your caregiver can provide you with a simple table to help with this. If you are using the spirometer at home, follow these instructions: SEEK MEDICAL CARE IF:  You are having difficultly using the spirometer. You have trouble using the spirometer as often as instructed. Your pain medication is not giving enough relief while using the spirometer. You develop fever of 100.5 F (38.1 C) or higher. SEEK IMMEDIATE MEDICAL CARE IF:  You cough up bloody sputum that had not been present before. You develop fever of 102 F (38.9 C) or greater. You develop worsening pain at or near the incision site. MAKE SURE YOU:  Understand these instructions. Will watch your condition. Will get help right away if you are not doing well or get worse. Document Released: 09/03/2006 Document Revised: 07/16/2011 Document Reviewed: 11/04/2006 [facility] Patient Information 2014 [place], [place].   ________________________________________________________________________

## 2023-08-26 NOTE — Progress Notes (Signed)
 COVID Vaccine Completed: yes  Date of COVID positive in last 90 days:  PCP - Overton Blotter ,MD Cardiologist - Percival Brace, MD LOV 08/14/23  CT- 07/25/23 Epic Chest x-ray - n/a EKG - 08/27/23 Epic/chart Stress Test - 08/07/23 [facility] ECHO - 08/07/23 [facility] Cardiac Cath - n/a Pacemaker/ICD device last checked: n/a Spinal Cord Stimulator: n/a  Bowel Prep - no  Sleep Study - yes CPAP - yes every night  Fasting Blood Sugar -  Checks Blood Sugar no checks at home per pt  Last dose of GLP1 agonist- Mounjaro GLP1 instructions:  Hold 7 days before surgery 08/17/23   Last dose of SGLT-2 inhibitors-  N/A SGLT-2 instructions:  Hold 3 days before surgery    Blood Thinner Instructions:  Last dose:   Time: Aspirin  Instructions: ASA 81, hold 5 days Last Dose: 08/27/23  Activity level: Can perform activities of daily living without stopping and without symptoms of chest pain or shortness of breath. Uses cane PRN. Not currently doing stairs   Anesthesia review: CAD, HTN, MI, OSA, WPW, DM2  Patient denies shortness of breath, fever, cough and chest pain at PAT appointment  Patient verbalized understanding of instructions that were given to them at the PAT appointment. Patient was also instructed that they will need to review over the PAT instructions again at home before surgery.

## 2023-08-27 ENCOUNTER — Encounter (HOSPITAL_COMMUNITY)
Admission: RE | Admit: 2023-08-27 | Discharge: 2023-08-27 | Disposition: A | Discharge status: Home / Self Care | Source: Ambulatory Visit | Attending: Orthopedic Surgery | Admitting: Orthopedic Surgery

## 2023-08-27 ENCOUNTER — Other Ambulatory Visit: Payer: Self-pay

## 2023-08-27 ENCOUNTER — Encounter (HOSPITAL_COMMUNITY): Payer: Self-pay

## 2023-08-27 VITALS — BP 114/72 | HR 64 | Temp 97.6°F | Resp 16 | Ht 73.0 in | Wt 226.0 lb

## 2023-08-27 DIAGNOSIS — G4733 Obstructive sleep apnea (adult) (pediatric): Secondary | ICD-10-CM | POA: Diagnosis not present

## 2023-08-27 DIAGNOSIS — Z01818 Encounter for other preprocedural examination: Secondary | ICD-10-CM

## 2023-08-27 DIAGNOSIS — Z87891 Personal history of nicotine dependence: Secondary | ICD-10-CM | POA: Diagnosis not present

## 2023-08-27 DIAGNOSIS — I252 Old myocardial infarction: Secondary | ICD-10-CM | POA: Insufficient documentation

## 2023-08-27 DIAGNOSIS — M1711 Unilateral primary osteoarthritis, right knee: Secondary | ICD-10-CM | POA: Diagnosis not present

## 2023-08-27 DIAGNOSIS — K219 Gastro-esophageal reflux disease without esophagitis: Secondary | ICD-10-CM | POA: Insufficient documentation

## 2023-08-27 DIAGNOSIS — F32A Depression, unspecified: Secondary | ICD-10-CM | POA: Insufficient documentation

## 2023-08-27 DIAGNOSIS — Z79899 Other long term (current) drug therapy: Secondary | ICD-10-CM | POA: Diagnosis not present

## 2023-08-27 DIAGNOSIS — E119 Type 2 diabetes mellitus without complications: Secondary | ICD-10-CM | POA: Diagnosis not present

## 2023-08-27 DIAGNOSIS — Z01812 Encounter for preprocedural laboratory examination: Secondary | ICD-10-CM | POA: Diagnosis present

## 2023-08-27 DIAGNOSIS — I2510 Atherosclerotic heart disease of native coronary artery without angina pectoris: Secondary | ICD-10-CM | POA: Diagnosis not present

## 2023-08-27 DIAGNOSIS — I10 Essential (primary) hypertension: Secondary | ICD-10-CM | POA: Diagnosis not present

## 2023-08-27 DIAGNOSIS — I456 Pre-excitation syndrome: Secondary | ICD-10-CM | POA: Insufficient documentation

## 2023-08-27 DIAGNOSIS — Z01810 Encounter for preprocedural cardiovascular examination: Secondary | ICD-10-CM | POA: Diagnosis present

## 2023-08-27 HISTORY — DX: Unspecified asthma, uncomplicated: J45.909

## 2023-08-27 HISTORY — DX: Personal history of urinary calculi: Z87.442

## 2023-08-27 LAB — BASIC METABOLIC PANEL WITH GFR
Anion gap: 7 (ref 5–15)
BUN: 32 mg/dL — ABNORMAL HIGH (ref 8–23)
CO2: 26 mmol/L (ref 22–32)
Calcium: 9.2 mg/dL (ref 8.9–10.3)
Chloride: 105 mmol/L (ref 98–111)
Creatinine, Ser: 0.78 mg/dL (ref 0.61–1.24)
GFR, Estimated: >60 mL/min (ref >60)
Glucose, Bld: 108 mg/dL — ABNORMAL HIGH (ref 70–99)
Potassium: 3.9 mmol/L (ref 3.5–5.1)
Sodium: 138 mmol/L (ref 135–145)

## 2023-08-27 LAB — CBC
HCT: 40.5 % (ref 39.0–52.0)
Hemoglobin: 13.3 g/dL (ref 13.0–17.0)
MCH: 30.4 pg (ref 26.0–34.0)
MCHC: 32.8 g/dL (ref 30.0–36.0)
MCV: 92.7 fL (ref 80.0–100.0)
Platelets: 149 10*3/uL — ABNORMAL LOW (ref 150–400)
RBC: 4.37 MIL/uL (ref 4.22–5.81)
RDW: 13.3 % (ref 11.5–15.5)
WBC: 4.5 10*3/uL (ref 4.0–10.5)
nRBC: 0 % (ref 0.0–0.2)

## 2023-08-27 LAB — SURGICAL PCR SCREEN
MRSA, PCR: NEGATIVE
Staphylococcus aureus: NEGATIVE

## 2023-08-27 LAB — GLUCOSE, CAPILLARY: Glucose-Capillary: 105 mg/dL — ABNORMAL HIGH (ref 70–99)

## 2023-08-28 ENCOUNTER — Encounter (HOSPITAL_COMMUNITY): Payer: Self-pay

## 2023-08-28 NOTE — Progress Notes (Signed)
 Case: [id number] Date/Time: 09/02/23 1015   Procedure: ARTHROPLASTY, KNEE, TOTAL (Right: Knee)   Anesthesia type: Choice   Pre-op diagnosis: right knee osteoarthritis   Location: [facility] ROOM 10 / [facility]   Surgeons: Liliane Rei, MD       DISCUSSION: Eric Hanna is a 78 yo male who presents to PAT prior to surgery above. PMH of former smoking, HTN, hx of NSTEMI (08/1998), CAD, WPW, OSA (uses CPAP), GERD, T2DM, arthritis, depression  Patient follows with Cardiology for above hx at [facility]. Last seen in clinic on 08/14/23 for pre op clearance. Noted to be doing well, without symptoms. He has known occluded distal left circumflex and is being treated medically. Cleared for surgery: "Patient awaiting right total knee replacement surgery/20/2025. 2D echocardiogram 08/07/2023 revealed LVEF 50-55%. Lexiscan Myoview 08/07/2023 revealed mild, predominantly fixed inferior wall defect with minimal redistribution, which appears unchanged compared to prior studies. The patient should be at low and acceptable risk for right total knee replacement surgery."  Seen by PCP on 07/18/23. All issues stable. Cleared from medical standpoint: "Stable and ready for knee surgery without further medical testing. Cardiology ordering a nuclear stress is noted."  LD Mounjaro: 4/12  VS: BP 114/72   Pulse 64   Temp 36.4 C (Oral)   Resp 16   Ht 6\' 1"  (1.854 m)   Wt 102.5 kg   SpO2 92%   BMI 29.82 kg/m   PROVIDERS: Jimmy Moulding, MD   LABS: Labs reviewed: Acceptable for surgery. (all labs ordered are listed, but only abnormal results are displayed)  Labs Reviewed  BASIC METABOLIC PANEL WITH GFR - Abnormal; Notable for the following components:      Result Value   Glucose, Bld 108 (*)    BUN 32 (*)    All other components within normal limits  CBC - Abnormal; Notable for the following components:   Platelets 149 (*)    All other components within normal limits  GLUCOSE, CAPILLARY - Abnormal; Notable for the  following components:   Glucose-Capillary 105 (*)    All other components within normal limits  SURGICAL PCR SCREEN     IMAGES: CT Chest 07/25/23:  IMPRESSION: *No acute findings in the chest. *Previously described right upper lobe 3 mm nodule on prior examination is not identified on today's study. *Stable right adrenal gland low-attenuation nodule measuring 1.6 x 1.7 cm. *Stable T10 fracture. *Stable bilateral renal cysts. *Extensive coronary artery calcifications. *Mildly ectatic ascending thoracic aorta measuring 4.2 x 4.1 cm. No aneurysms.  EKG 08/27/23:  Sinus rhythm with 1st degree A-V block, rate 64 Possible Inferior infarct , age undetermined Abnormal ECG  CV:  Stress test 08/07/23:  FINDINGS: Regional wall motion:  reveals normal myocardial thickening and wall motion. The overall quality of the study is good.   Artifacts noted: no Left ventricular cavity: normal.  Perfusion Analysis:  SPECT images demonstrate small perfusion abnormality of mild intensity is present in the inferior region on the stress images. Defect type : Fixed    Echo 08/07/23:  CONCLUSION ------------------------------------------------------------------------------- NORMAL LEFT VENTRICULAR SYSTOLIC FUNCTION WITH MILD LVH ESTIMATED EF: 50%, CALC EF(2D): 52% NORMAL LA PRESSURES WITH DIASTOLIC DYSFUNCTION (GRADE 1) NORMAL RIGHT VENTRICULAR SYSTOLIC FUNCTION VALVULAR REGURGITATION: MILD AR, TRIVIAL MR, MILD PR, MILD TR NO VALVULAR STENOSIS  Past Medical History:  Diagnosis Date   Asthma    as a child   Basal cell carcinoma 04/16/2022   L neck - Excised 05/29/22   Coronary artery disease  DDD (degenerative disc disease), cervical    Depression    Diabetes mellitus without complication (HCC)    borderline; no meds; no blood sugar checks   Diarrhea    GERD (gastroesophageal reflux disease)    History of kidney stones    Hypertension    Hypertrophy of prostate    Impotence,  organic    Lower urinary tract symptoms (LUTS)    Lumbar trigger point syndrome    Malignant neoplasm of bladder (HCC)    Myocardial infarction (HCC) 2000   Obesity    OSA (obstructive sleep apnea)    CPAP   Osteoarthritis    Sleep apnea    Spondylolisthesis of cervical region    Testicular hypofunction    WPW (Wolff-Parkinson-White syndrome)     Past Surgical History:  Procedure Laterality Date   BACK SURGERY     lumbar, has large scar   CARDIAC CATHETERIZATION  2000   CATARACT EXTRACTION W/PHACO Left 06/05/2017   Procedure: CATARACT EXTRACTION PHACO AND INTRAOCULAR LENS PLACEMENT (IOC) LEFT;  Surgeon: Annell Kidney, MD;  Location: [facility];  Service: Ophthalmology;  Laterality: Left;  sleep apnea   CATARACT EXTRACTION W/PHACO Right 07/03/2017   Procedure: CATARACT EXTRACTION PHACO AND INTRAOCULAR LENS PLACEMENT (IOC) RIGHT;  Surgeon: Annell Kidney, MD;  Location: [facility];  Service: Ophthalmology;  Laterality: Right;  sleep apnea   CHIN IMPLANT      COLONOSCOPY     COLONOSCOPY WITH PROPOFOL  N/A 09/07/2015   Procedure: COLONOSCOPY WITH PROPOFOL ;  Surgeon: Cassie Click, MD;  Location: [facility] END[facility];  Service: Endoscopy;  Laterality: N/A;   HAND SURGERY     LITHOTRIPSY     STOMACH REDUCTION WITHOUT BYPASS     X-STOP IMPLANTATION      MEDICATIONS:  acetaminophen  (TYLENOL ) 650 MG CR tablet   alendronate (FOSAMAX) 70 MG tablet   aspirin  81 MG tablet   CALCIUM  MAGNESIUM 750 PO   cyanocobalamin 1000 MCG tablet   loratadine (ALLERGY RELIEF) 10 MG tablet   metoprolol  succinate (TOPROL -XL) 25 MG 24 hr tablet   Multiple Vitamin (MULTIVITAMIN) capsule   Omega-3 Fatty Acids (FISH OIL) 1200 MG CPDR   omeprazole (PRILOSEC) 20 MG capsule   ondansetron  (ZOFRAN -ODT) 4 MG disintegrating tablet   sertraline  (ZOLOFT ) 50 MG tablet   simvastatin (ZOCOR) 80 MG tablet   tirzepatide (MOUNJARO) 10 MG/0.5ML Pen   traZODone  (DESYREL ) 50 MG tablet   No  current facility-administered medications for this encounter.   Antoinette Kirschner [facility] Surgical Short Stay/Anesthesiology [facility] Phone [phone] 08/28/2023 9:58 AM

## 2023-08-28 NOTE — Anesthesia Preprocedure Evaluation (Addendum)
 Anesthesia Evaluation  Patient identified by MRN, date of birth, ID band Patient awake    Reviewed: Allergy & Precautions, H&P , NPO status , Patient's Chart, lab work & pertinent test results  Airway Mallampati: II  TM Distance: >3 FB Neck ROM: Full    Dental no notable dental hx. (+) Teeth Intact   Pulmonary asthma , sleep apnea and Continuous Positive Airway Pressure Ventilation , former smoker   Pulmonary exam normal breath sounds clear to auscultation       Cardiovascular Exercise Tolerance: Good hypertension, Pt. on medications and Pt. on home beta blockers + CAD and + Past MI   Rhythm:Regular Rate:Normal     Neuro/Psych    Depression    negative neurological ROS     GI/Hepatic Neg liver ROS,GERD  Medicated,,  Endo/Other  diabetes, Type 2, Oral Hypoglycemic Agents    Renal/GU negative Renal ROS  negative genitourinary   Musculoskeletal  (+) Arthritis , Osteoarthritis,    Abdominal   Peds  Hematology negative hematology ROS (+)   Anesthesia Other Findings   Reproductive/Obstetrics negative OB ROS                             Anesthesia Physical Anesthesia Plan  ASA: 3  Anesthesia Plan: Spinal   Post-op Pain Management: Regional block* and Ofirmev  IV (intra-op)*   Induction: Intravenous  PONV Risk Score and Plan: 2 and Ondansetron , Dexamethasone and Propofol  infusion  Airway Management Planned: Natural Airway and Simple Face Mask  Additional Equipment:   Intra-op Plan:   Post-operative Plan:   Informed Consent: I have reviewed the patients History and Physical, chart, labs and discussed the procedure including the risks, benefits and alternatives for the proposed anesthesia with the patient or authorized representative who has indicated his/her understanding and acceptance.     Dental advisory given  Plan Discussed with: CRNA  Anesthesia Plan Comments: (See PAT  note from 4/22)        Anesthesia Quick Evaluation

## 2023-09-02 ENCOUNTER — Ambulatory Visit (HOSPITAL_BASED_OUTPATIENT_CLINIC_OR_DEPARTMENT_OTHER): Payer: Self-pay | Admitting: Anesthesiology

## 2023-09-02 ENCOUNTER — Encounter (HOSPITAL_COMMUNITY)
Admission: RE | Disposition: A | Discharge status: Home / Self Care | Payer: Self-pay | Source: Home / Self Care | Attending: Orthopedic Surgery

## 2023-09-02 ENCOUNTER — Other Ambulatory Visit: Payer: Self-pay

## 2023-09-02 ENCOUNTER — Ambulatory Visit (HOSPITAL_COMMUNITY): Payer: Self-pay | Admitting: Medical

## 2023-09-02 ENCOUNTER — Encounter (HOSPITAL_COMMUNITY): Payer: Self-pay | Admitting: Orthopedic Surgery

## 2023-09-02 ENCOUNTER — Observation Stay (HOSPITAL_COMMUNITY)
Admission: RE | Admit: 2023-09-02 | Discharge: 2023-09-03 | Disposition: A | Discharge status: Home / Self Care | Payer: Medicare Other | Attending: Orthopedic Surgery | Admitting: Orthopedic Surgery

## 2023-09-02 DIAGNOSIS — E119 Type 2 diabetes mellitus without complications: Secondary | ICD-10-CM

## 2023-09-02 DIAGNOSIS — M1711 Unilateral primary osteoarthritis, right knee: Principal | ICD-10-CM | POA: Diagnosis present

## 2023-09-02 DIAGNOSIS — Z85828 Personal history of other malignant neoplasm of skin: Secondary | ICD-10-CM | POA: Diagnosis not present

## 2023-09-02 DIAGNOSIS — M1712 Unilateral primary osteoarthritis, left knee: Secondary | ICD-10-CM

## 2023-09-02 DIAGNOSIS — G20C Parkinsonism, unspecified: Secondary | ICD-10-CM | POA: Insufficient documentation

## 2023-09-02 DIAGNOSIS — I2510 Atherosclerotic heart disease of native coronary artery without angina pectoris: Secondary | ICD-10-CM

## 2023-09-02 DIAGNOSIS — Z7982 Long term (current) use of aspirin: Secondary | ICD-10-CM | POA: Insufficient documentation

## 2023-09-02 DIAGNOSIS — Z8551 Personal history of malignant neoplasm of bladder: Secondary | ICD-10-CM | POA: Insufficient documentation

## 2023-09-02 DIAGNOSIS — Z79899 Other long term (current) drug therapy: Secondary | ICD-10-CM | POA: Insufficient documentation

## 2023-09-02 DIAGNOSIS — I10 Essential (primary) hypertension: Secondary | ICD-10-CM | POA: Diagnosis not present

## 2023-09-02 DIAGNOSIS — J45909 Unspecified asthma, uncomplicated: Secondary | ICD-10-CM | POA: Insufficient documentation

## 2023-09-02 DIAGNOSIS — Z7984 Long term (current) use of oral hypoglycemic drugs: Secondary | ICD-10-CM

## 2023-09-02 DIAGNOSIS — M179 Osteoarthritis of knee, unspecified: Principal | ICD-10-CM | POA: Diagnosis present

## 2023-09-02 HISTORY — PX: TOTAL KNEE ARTHROPLASTY: SHX125

## 2023-09-02 LAB — GLUCOSE, CAPILLARY
Glucose-Capillary: 113 mg/dL — ABNORMAL HIGH (ref 70–99)
Glucose-Capillary: 105 mg/dL — ABNORMAL HIGH (ref 70–99)

## 2023-09-02 SURGERY — ARTHROPLASTY, KNEE, TOTAL
Anesthesia: Spinal | Site: Knee | Laterality: Right

## 2023-09-02 MED ORDER — ACETAMINOPHEN 10 MG/ML IV SOLN
1000.0000 mg | Freq: Four times a day (QID) | INTRAVENOUS | Status: DC
  Administered 2023-09-02: 1000 mg via INTRAVENOUS
  Filled 2023-09-02: qty 100

## 2023-09-02 MED ORDER — DEXAMETHASONE SODIUM PHOSPHATE 10 MG/ML IJ SOLN: 8.0000 mg | Freq: Once | INTRAMUSCULAR | Status: AC

## 2023-09-02 MED ORDER — DOCUSATE SODIUM 100 MG PO CAPS
100.0000 mg | ORAL_CAPSULE | Freq: Two times a day (BID) | ORAL | Status: DC
  Administered 2023-09-02 – 2023-09-03 (×2): 100 mg via ORAL
  Filled 2023-09-02 (×2): qty 1

## 2023-09-02 MED ORDER — BUPIVACAINE LIPOSOME 1.3 % IJ SUSP: 20.0000 mL | Freq: Once | INTRAMUSCULAR | Status: AC

## 2023-09-02 MED ORDER — EPHEDRINE 5 MG/ML INJ
INTRAVENOUS | Status: AC
  Filled 2023-09-02: qty 5

## 2023-09-02 MED ORDER — PROPOFOL 500 MG/50ML IV EMUL
INTRAVENOUS | Status: AC
  Filled 2023-09-02: qty 50

## 2023-09-02 MED ORDER — BUPIVACAINE-EPINEPHRINE (PF) 0.5% -1:200000 IJ SOLN
INTRAMUSCULAR | Status: DC | PRN
Start: 2023-09-02 — End: 2023-09-02
  Administered 2023-09-02: 20 mL via PERINEURAL

## 2023-09-02 MED ORDER — PROPOFOL 500 MG/50ML IV EMUL
INTRAVENOUS | Status: DC | PRN
  Administered 2023-09-02: 90 ug/kg/min via INTRAVENOUS

## 2023-09-02 MED ORDER — METOPROLOL SUCCINATE ER 25 MG PO TB24: 25.0000 mg | ORAL_TABLET | Freq: Every evening | ORAL | Status: DC

## 2023-09-02 MED ORDER — ALBUMIN HUMAN 5 % IV SOLN
INTRAVENOUS | Status: AC
Start: 2023-09-02 — End: ?
  Filled 2023-09-02: qty 250

## 2023-09-02 MED ORDER — FENTANYL CITRATE PF 50 MCG/ML IJ SOSY: 25.0000 ug | PREFILLED_SYRINGE | INTRAMUSCULAR | Status: DC | PRN

## 2023-09-02 MED ORDER — ATORVASTATIN CALCIUM 40 MG PO TABS: 40.0000 mg | ORAL_TABLET | Freq: Every day | ORAL | Status: DC

## 2023-09-02 MED ORDER — DEXAMETHASONE SODIUM PHOSPHATE 10 MG/ML IJ SOLN
10.0000 mg | Freq: Once | INTRAMUSCULAR | Status: AC
  Administered 2023-09-03: 10 mg via INTRAVENOUS
  Filled 2023-09-02: qty 1

## 2023-09-02 MED ORDER — SODIUM CHLORIDE (PF) 0.9 % IJ SOLN: INTRAMUSCULAR | Status: DC | PRN

## 2023-09-02 MED ORDER — CEFAZOLIN SODIUM-DEXTROSE 2-4 GM/100ML-% IV SOLN
2.0000 g | INTRAVENOUS | Status: AC
  Administered 2023-09-02: 2 g via INTRAVENOUS
  Filled 2023-09-02: qty 100

## 2023-09-02 MED ORDER — ATORVASTATIN CALCIUM 40 MG PO TABS
40.0000 mg | ORAL_TABLET | Freq: Every day | ORAL | Status: DC
  Administered 2023-09-02: 40 mg via ORAL
  Filled 2023-09-02: qty 1

## 2023-09-02 MED ORDER — POLYETHYLENE GLYCOL 3350 17 G PO PACK: 17.0000 g | PACK | Freq: Every day | ORAL | Status: DC | PRN

## 2023-09-02 MED ORDER — FENTANYL CITRATE PF 50 MCG/ML IJ SOSY
50.0000 ug | PREFILLED_SYRINGE | INTRAMUSCULAR | Status: DC
  Administered 2023-09-02: 50 ug via INTRAVENOUS
  Filled 2023-09-02: qty 2

## 2023-09-02 MED ORDER — ORAL CARE MOUTH RINSE: 15.0000 mL | Freq: Once | OROMUCOSAL | Status: AC

## 2023-09-02 MED ORDER — DEXAMETHASONE SODIUM PHOSPHATE 10 MG/ML IJ SOLN
INTRAMUSCULAR | Status: AC
  Filled 2023-09-02: qty 1

## 2023-09-02 MED ORDER — ACETAMINOPHEN 500 MG PO TABS
1000.0000 mg | ORAL_TABLET | Freq: Four times a day (QID) | ORAL | Status: DC
  Administered 2023-09-02 – 2023-09-03 (×3): 1000 mg via ORAL
  Filled 2023-09-02 (×3): qty 2

## 2023-09-02 MED ORDER — PROPOFOL 1000 MG/100ML IV EMUL
INTRAVENOUS | Status: AC
  Filled 2023-09-02: qty 100

## 2023-09-02 MED ORDER — ACETAMINOPHEN 325 MG PO TABS: 325.0000 mg | ORAL_TABLET | Freq: Four times a day (QID) | ORAL | Status: DC | PRN

## 2023-09-02 MED ORDER — POVIDONE-IODINE 10 % EX SWAB: 2.0000 | Freq: Once | CUTANEOUS | Status: AC

## 2023-09-02 MED ORDER — STERILE WATER FOR IRRIGATION IR SOLN
Status: DC | PRN
  Administered 2023-09-02: 1000 mL

## 2023-09-02 MED ORDER — SODIUM CHLORIDE (PF) 0.9 % IJ SOLN
INTRAMUSCULAR | Status: AC
  Filled 2023-09-02: qty 10

## 2023-09-02 MED ORDER — TRANEXAMIC ACID-NACL 1000-0.7 MG/100ML-% IV SOLN
1000.0000 mg | INTRAVENOUS | Status: AC
  Administered 2023-09-02: 1000 mg via INTRAVENOUS
  Filled 2023-09-02: qty 100

## 2023-09-02 MED ORDER — LACTATED RINGERS IV SOLN: INTRAVENOUS | Status: DC

## 2023-09-02 MED ORDER — FLEET ENEMA RE ENEM: 1.0000 | ENEMA | Freq: Once | RECTAL | Status: DC | PRN

## 2023-09-02 MED ORDER — 0.9 % SODIUM CHLORIDE (POUR BTL) OPTIME
TOPICAL | Status: DC | PRN
Start: 2023-09-02 — End: 2023-09-02
  Administered 2023-09-02: 1000 mL

## 2023-09-02 MED ORDER — BUPIVACAINE IN DEXTROSE 0.75-8.25 % IT SOLN
INTRATHECAL | Status: DC | PRN
  Administered 2023-09-02: 1.6 mL via INTRATHECAL

## 2023-09-02 MED ORDER — PANTOPRAZOLE SODIUM 40 MG PO TBEC
40.0000 mg | DELAYED_RELEASE_TABLET | Freq: Every day | ORAL | Status: DC
  Administered 2023-09-02 – 2023-09-03 (×2): 40 mg via ORAL
  Filled 2023-09-02 (×2): qty 1

## 2023-09-02 MED ORDER — PROPOFOL 10 MG/ML IV BOLUS
INTRAVENOUS | Status: DC | PRN
Start: 2023-09-02 — End: 2023-09-02
  Administered 2023-09-02 (×3): 10 mg via INTRAVENOUS

## 2023-09-02 MED ORDER — METOCLOPRAMIDE HCL 5 MG/ML IJ SOLN: 5.0000 mg | Freq: Three times a day (TID) | INTRAMUSCULAR | Status: DC | PRN

## 2023-09-02 MED ORDER — METOPROLOL SUCCINATE ER 25 MG PO TB24
25.0000 mg | ORAL_TABLET | Freq: Every day | ORAL | Status: DC
  Administered 2023-09-02: 25 mg via ORAL
  Filled 2023-09-02: qty 1

## 2023-09-02 MED ORDER — OXYCODONE HCL 5 MG PO TABS
10.0000 mg | ORAL_TABLET | ORAL | Status: DC | PRN
  Administered 2023-09-03 (×3): 15 mg via ORAL
  Filled 2023-09-02 (×3): qty 3

## 2023-09-02 MED ORDER — CHLORHEXIDINE GLUCONATE 0.12 % MT SOLN
15.0000 mL | Freq: Once | OROMUCOSAL | Status: AC
  Administered 2023-09-02: 15 mL via OROMUCOSAL

## 2023-09-02 MED ORDER — LORATADINE 10 MG PO TABS
10.0000 mg | ORAL_TABLET | Freq: Every day | ORAL | Status: DC
  Administered 2023-09-03: 10 mg via ORAL
  Filled 2023-09-02: qty 1

## 2023-09-02 MED ORDER — MORPHINE SULFATE (PF) 2 MG/ML IV SOLN
1.0000 mg | INTRAVENOUS | Status: DC | PRN
  Administered 2023-09-02 – 2023-09-03 (×2): 2 mg via INTRAVENOUS
  Filled 2023-09-02 (×2): qty 1

## 2023-09-02 MED ORDER — METHOCARBAMOL 500 MG PO TABS
500.0000 mg | ORAL_TABLET | Freq: Four times a day (QID) | ORAL | Status: DC | PRN
  Administered 2023-09-02 – 2023-09-03 (×2): 500 mg via ORAL
  Filled 2023-09-02 (×2): qty 1

## 2023-09-02 MED ORDER — METOCLOPRAMIDE HCL 5 MG PO TABS: 5.0000 mg | ORAL_TABLET | Freq: Three times a day (TID) | ORAL | Status: DC | PRN

## 2023-09-02 MED ORDER — METHOCARBAMOL 1000 MG/10ML IJ SOLN: 500.0000 mg | Freq: Four times a day (QID) | INTRAMUSCULAR | Status: DC | PRN

## 2023-09-02 MED ORDER — DEXAMETHASONE SODIUM PHOSPHATE 10 MG/ML IJ SOLN
INTRAMUSCULAR | Status: DC | PRN
  Administered 2023-09-02: 8 mg via INTRAVENOUS

## 2023-09-02 MED ORDER — SODIUM CHLORIDE (PF) 0.9 % IJ SOLN
INTRAMUSCULAR | Status: AC
  Filled 2023-09-02: qty 50

## 2023-09-02 MED ORDER — SODIUM CHLORIDE 0.9 % IR SOLN
Status: DC | PRN
  Administered 2023-09-02: 1000 mL

## 2023-09-02 MED ORDER — INSULIN ASPART 100 UNIT/ML IJ SOLN: 0.0000 [IU] | INTRAMUSCULAR | Status: DC | PRN

## 2023-09-02 MED ORDER — OXYCODONE HCL 5 MG PO TABS
5.0000 mg | ORAL_TABLET | ORAL | Status: DC | PRN
  Administered 2023-09-02 (×2): 10 mg via ORAL
  Filled 2023-09-02 (×3): qty 2

## 2023-09-02 MED ORDER — ONDANSETRON HCL 4 MG/2ML IJ SOLN: 4.0000 mg | Freq: Four times a day (QID) | INTRAMUSCULAR | Status: DC | PRN

## 2023-09-02 MED ORDER — PHENOL 1.4 % MT LIQD: 1.0000 | OROMUCOSAL | Status: DC | PRN

## 2023-09-02 MED ORDER — PHENYLEPHRINE HCL-NACL 20-0.9 MG/250ML-% IV SOLN
INTRAVENOUS | Status: DC | PRN
  Administered 2023-09-02: 30 ug/min via INTRAVENOUS

## 2023-09-02 MED ORDER — DIPHENHYDRAMINE HCL 12.5 MG/5ML PO ELIX: 12.5000 mg | ORAL_SOLUTION | ORAL | Status: DC | PRN

## 2023-09-02 MED ORDER — CEFAZOLIN SODIUM-DEXTROSE 2-4 GM/100ML-% IV SOLN
2.0000 g | Freq: Four times a day (QID) | INTRAVENOUS | Status: AC
  Administered 2023-09-02 (×2): 2 g via INTRAVENOUS
  Filled 2023-09-02 (×2): qty 100

## 2023-09-02 MED ORDER — ONDANSETRON HCL 4 MG/2ML IJ SOLN
INTRAMUSCULAR | Status: DC | PRN
Start: 2023-09-02 — End: 2023-09-02
  Administered 2023-09-02: 4 mg via INTRAVENOUS

## 2023-09-02 MED ORDER — MENTHOL 3 MG MT LOZG: 1.0000 | LOZENGE | OROMUCOSAL | Status: DC | PRN

## 2023-09-02 MED ORDER — SODIUM CHLORIDE (PF) 0.9 % IJ SOLN
INTRAMUSCULAR | Status: DC | PRN
  Administered 2023-09-02: 80 mL

## 2023-09-02 MED ORDER — ONDANSETRON HCL 4 MG/2ML IJ SOLN
INTRAMUSCULAR | Status: AC
  Filled 2023-09-02: qty 2

## 2023-09-02 MED ORDER — BISACODYL 10 MG RE SUPP: 10.0000 mg | Freq: Every day | RECTAL | Status: DC | PRN

## 2023-09-02 MED ORDER — BUPIVACAINE LIPOSOME 1.3 % IJ SUSP
INTRAMUSCULAR | Status: AC
  Filled 2023-09-02: qty 20

## 2023-09-02 MED ORDER — SERTRALINE HCL 50 MG PO TABS
50.0000 mg | ORAL_TABLET | Freq: Every day | ORAL | Status: DC
  Administered 2023-09-02: 50 mg via ORAL
  Filled 2023-09-02: qty 1

## 2023-09-02 MED ORDER — RIVAROXABAN 10 MG PO TABS
10.0000 mg | ORAL_TABLET | Freq: Every day | ORAL | Status: DC
  Administered 2023-09-03: 10 mg via ORAL
  Filled 2023-09-02: qty 1

## 2023-09-02 MED ORDER — SODIUM CHLORIDE 0.9 % IV SOLN: INTRAVENOUS | Status: DC

## 2023-09-02 MED ORDER — ONDANSETRON HCL 4 MG PO TABS: 4.0000 mg | ORAL_TABLET | Freq: Four times a day (QID) | ORAL | Status: DC | PRN

## 2023-09-02 SURGICAL SUPPLY — 44 items
ATTUNE MED DOME PAT 41 KNEE (Knees) IMPLANT
ATTUNE PS FEM RT SZ 7 CEM KNEE (Femur) IMPLANT
ATTUNE PSRP INSR SZ7 8 KNEE (Insert) IMPLANT
BAG COUNTER SPONGE SURGICOUNT (BAG) IMPLANT
BAG ZIPLOCK 12X15 (MISCELLANEOUS) ×1 IMPLANT
BASE TIBIAL ROT PLAT SZ 8 KNEE (Knees) IMPLANT
BLADE SAG 18X100X1.27 (BLADE) ×1 IMPLANT
BLADE SAW SGTL 11.0X1.19X90.0M (BLADE) ×1 IMPLANT
BNDG ELASTIC 6INX 5YD STR LF (GAUZE/BANDAGES/DRESSINGS) ×1 IMPLANT
BOWL SMART MIX CTS (DISPOSABLE) ×1 IMPLANT
CEMENT HV SMART SET (Cement) ×2 IMPLANT
COVER SURGICAL LIGHT HANDLE (MISCELLANEOUS) ×1 IMPLANT
CUFF TRNQT CYL 34X4.125X (TOURNIQUET CUFF) ×1 IMPLANT
DERMABOND ADVANCED .7 DNX12 (GAUZE/BANDAGES/DRESSINGS) ×1 IMPLANT
DRAPE U-SHAPE 47X51 STRL (DRAPES) ×1 IMPLANT
DRSG AQUACEL AG ADV 3.5X10 (GAUZE/BANDAGES/DRESSINGS) ×1 IMPLANT
DURAPREP 26ML APPLICATOR (WOUND CARE) ×1 IMPLANT
ELECT PENCIL ROCKER SW 15FT (MISCELLANEOUS) ×1 IMPLANT
ELECT REM PT RETURN 15FT ADLT (MISCELLANEOUS) ×1 IMPLANT
GLOVE BIO SURGEON STRL SZ 6.5 (GLOVE) IMPLANT
GLOVE BIO SURGEON STRL SZ7 (GLOVE) IMPLANT
GLOVE BIO SURGEON STRL SZ8 (GLOVE) ×1 IMPLANT
GLOVE BIOGEL PI IND STRL 7.0 (GLOVE) ×1 IMPLANT
GLOVE BIOGEL PI IND STRL 8 (GLOVE) ×1 IMPLANT
GOWN STRL REUS W/ TWL LRG LVL3 (GOWN DISPOSABLE) ×1 IMPLANT
HOLDER FOLEY CATH W/STRAP (MISCELLANEOUS) ×1 IMPLANT
IMMOBILIZER KNEE 20 (SOFTGOODS) ×1 IMPLANT
IMMOBILIZER KNEE 20 THIGH 36 (SOFTGOODS) ×1 IMPLANT
KIT TURNOVER KIT A (KITS) ×1 IMPLANT
MANIFOLD NEPTUNE II (INSTRUMENTS) ×1 IMPLANT
NS IRRIG 1000ML POUR BTL (IV SOLUTION) ×1 IMPLANT
PACK TOTAL KNEE CUSTOM (KITS) ×1 IMPLANT
PADDING CAST COTTON 6X4 STRL (CAST SUPPLIES) ×2 IMPLANT
PIN STEINMAN FIXATION KNEE (PIN) IMPLANT
PROTECTOR NERVE ULNAR (MISCELLANEOUS) ×1 IMPLANT
SET HNDPC FAN SPRY TIP SCT (DISPOSABLE) ×1 IMPLANT
SUT MNCRL AB 4-0 PS2 18 (SUTURE) ×1 IMPLANT
SUT VIC AB 2-0 CT1 TAPERPNT 27 (SUTURE) ×3 IMPLANT
SUTURE STRATFX 0 PDS 27 VIOLET (SUTURE) ×1 IMPLANT
TOWEL GREEN STERILE FF (TOWEL DISPOSABLE) ×1 IMPLANT
TRAY FOLEY MTR SLVR 16FR STAT (SET/KITS/TRAYS/PACK) ×1 IMPLANT
TUBE SUCTION HIGH CAP CLEAR NV (SUCTIONS) ×1 IMPLANT
WATER STERILE IRR 1000ML POUR (IV SOLUTION) ×2 IMPLANT
WRAP KNEE MAXI GEL POST OP (GAUZE/BANDAGES/DRESSINGS) ×1 IMPLANT

## 2023-09-02 NOTE — Plan of Care (Signed)
  Problem: Health Behavior/Discharge Planning: Goal: Ability to manage health-related needs will improve Outcome: Progressing   Problem: Clinical Measurements: Goal: Ability to maintain clinical measurements within normal limits will improve Outcome: Progressing Goal: Will remain free from infection Outcome: Progressing Goal: Diagnostic test results will improve Outcome: Progressing Goal: Respiratory complications will improve Outcome: Progressing Goal: Cardiovascular complication will be avoided Outcome: Progressing   Problem: Activity: Goal: Risk for activity intolerance will decrease Outcome: Progressing   Problem: Coping: Goal: Level of anxiety will decrease Outcome: Progressing   Problem: Elimination: Goal: Will not experience complications related to bowel motility Outcome: Progressing Goal: Will not experience complications related to urinary retention Outcome: Progressing   Problem: Pain Managment: Goal: General experience of comfort will improve and/or be controlled Outcome: Progressing   Problem: Safety: Goal: Ability to remain free from injury will improve Outcome: Progressing   Problem: Skin Integrity: Goal: Risk for impaired skin integrity will decrease Outcome: Progressing   Problem: Education: Goal: Knowledge of the prescribed therapeutic regimen will improve Outcome: Progressing   Problem: Activity: Goal: Ability to avoid complications of mobility impairment will improve Outcome: Progressing Goal: Range of joint motion will improve Outcome: Progressing   Problem: Clinical Measurements: Goal: Postoperative complications will be avoided or minimized Outcome: Progressing   Problem: Pain Management: Goal: Pain level will decrease with appropriate interventions Outcome: Progressing   Problem: Skin Integrity: Goal: Will show signs of wound healing Outcome: Progressing

## 2023-09-02 NOTE — Op Note (Signed)
 OPERATIVE REPORT-TOTAL KNEE ARTHROPLASTY   Pre-operative diagnosis- Osteoarthritis  Right knee(s)  Post-operative diagnosis- Osteoarthritis Right knee(s)  Procedure-  Right  Total Knee Arthroplasty  Surgeon- Henri Loft. Zaccheaus Storlie, MD  Assistant- Sharlynn Dear, PA-C   Anesthesia-   Adductor canal block and spinal  EBL-30 mL   Drains None  Tourniquet time-  Total Tourniquet Time Documented: Thigh (Right) - 39 minutes Total: Thigh (Right) - 39 minutes     Complications- None  Condition-PACU - hemodynamically stable.   Brief Clinical Note  Eric Hanna is a 78 y.o. year old male with end stage OA of his right knee with progressively worsening pain and dysfunction. He has constant pain, with activity and at rest and significant functional deficits with difficulties even with ADLs. He has had extensive non-op management including analgesics, injections of cortisone and home exercise program, but remains in significant pain with significant dysfunction. Radiographs show bone on bone arthritis medial and patellofemoral. He presents now for right Total Knee Arthroplasty.     Procedure in detail---   The patient is brought into the operating room and positioned supine on the operating table. After successful administration of  Adductor canal block and spinal,   a tourniquet is placed high on the  Right thigh(s) and the lower extremity is prepped and draped in the usual sterile fashion. Time out is performed by the operating team and then the  Right lower extremity is wrapped in Esmarch, knee flexed and the tourniquet inflated to 300 mmHg.       A midline incision is made with a ten blade through the subcutaneous tissue to the level of the extensor mechanism. A fresh blade is used to make a medial parapatellar arthrotomy. Soft tissue over the proximal medial tibia is subperiosteally elevated to the joint line with a knife and into the semimembranosus bursa with a Cobb elevator. Soft  tissue over the proximal lateral tibia is elevated with attention being paid to avoiding the patellar tendon on the tibial tubercle. The patella is everted, knee flexed 90 degrees and the ACL and PCL are removed. Findings are bone on bone medial and patellofemoral with large global osteophytes        The drill is used to create a starting hole in the distal femur and the canal is thoroughly irrigated with sterile saline to remove the fatty contents. The 5 degree Right  valgus alignment guide is placed into the femoral canal and the distal femoral cutting block is pinned to remove 10 mm off the distal femur. Resection is made with an oscillating saw.      The tibia is subluxed forward and the menisci are removed. The extramedullary alignment guide is placed referencing proximally at the medial aspect of the tibial tubercle and distally along the second metatarsal axis and tibial crest. The block is pinned to remove 2mm off the more deficient medial  side. Resection is made with an oscillating saw. Size 8is the most appropriate size for the tibia and the proximal tibia is prepared with the modular drill and keel punch for that size.      The femoral sizing guide is placed and size 7 is most appropriate. Rotation is marked off the epicondylar axis and confirmed by creating a rectangular flexion gap at 90 degrees. The size 7 cutting block is pinned in this rotation and the anterior, posterior and chamfer cuts are made with the oscillating saw. The intercondylar block is then placed and that cut is made.  Trial size 8 tibial component, trial size 7 posterior stabilized femur and a 8  mm posterior stabilized rotating platform insert trial is placed. Full extension is achieved with excellent varus/valgus and anterior/posterior balance throughout full range of motion. The patella is everted and thickness measured to be 27  mm. Free hand resection is taken to 15 mm, a 41 template is placed, lug holes are drilled,  trial patella is placed, and it tracks normally. Osteophytes are removed off the posterior femur with the trial in place. All trials are removed and the cut bone surfaces prepared with pulsatile lavage. Cement is mixed and once ready for implantation, the size 8 tibial implant, size  7 posterior stabilized femoral component, and the size 41 patella are cemented in place and the patella is held with the clamp. The trial insert is placed and the knee held in full extension. The Exparel (20 ml mixed with 60 ml saline) is injected into the extensor mechanism, posterior capsule, medial and lateral gutters and subcutaneous tissues.  All extruded cement is removed and once the cement is hard the permanent 8 mm posterior stabilized rotating platform insert is placed into the tibial tray.      The wound is copiously irrigated with saline solution and the extensor mechanism closed with # 0 Stratofix suture. The tourniquet is released for a total tourniquet time of 41  minutes. Flexion against gravity is 140 degrees and the patella tracks normally. Subcutaneous tissue is closed with 2.0 vicryl and subcuticular with running 4.0 Monocryl. The incision is cleaned and dried and steri-strips and a bulky sterile dressing are applied. The limb is placed into a knee immobilizer and the patient is awakened and transported to recovery in stable condition.      Please note that a surgical assistant was a medical necessity for this procedure in order to perform it in a safe and expeditious manner. Surgical assistant was necessary to retract the ligaments and vital neurovascular structures to prevent injury to them and also necessary for proper positioning of the limb to allow for anatomic placement of the prosthesis.   Henri Loft Cleopatra Sardo, MD    09/02/2023, 11:32 AM

## 2023-09-02 NOTE — Transfer of Care (Signed)
 Immediate Anesthesia Transfer of Care Note  Patient: Eric Hanna  Procedure(s) Performed: RIGHT TOTAL KNEE ARTHROPLASTY (Right: Knee)  Patient Location: PACU  Anesthesia Type:Spinal  Level of Consciousness: sedated  Airway & Oxygen Therapy: Patient Spontanous Breathing and Patient connected to face mask oxygen  Post-op Assessment: Report given to RN and Post -op Vital signs reviewed and stable  Post vital signs: Reviewed and stable  Last Vitals:  Vitals Value Taken Time  BP 111/64 09/02/23 1150  Temp    Pulse 66 09/02/23 1152  Resp 16 09/02/23 1152  SpO2 99 % 09/02/23 1152  Vitals shown include unfiled device data.  Last Pain:  Vitals:   09/02/23 1000  TempSrc:   PainSc: 0-No pain         Complications: No notable events documented.

## 2023-09-02 NOTE — Interval H&P Note (Signed)
 History and Physical Interval Note:  09/02/2023 8:32 AM  Eric Hanna  has presented today for surgery, with the diagnosis of right knee osteoarthritis.  The various methods of treatment have been discussed with the patient and family. After consideration of risks, benefits and other options for treatment, the patient has consented to  Procedure(s): ARTHROPLASTY, KNEE, TOTAL (Right) as a surgical intervention.  The patient's history has been reviewed, patient examined, no change in status, stable for surgery.  I have reviewed the patient's chart and labs.  Questions were answered to the patient's satisfaction.     Samuel Crock [name]

## 2023-09-02 NOTE — Progress Notes (Signed)
 Orthopedic Tech Progress Note Patient Details:  Eric Hanna 05-12-1945 [id number]  CPM Right Knee CPM Right Knee: On Right Knee Flexion (Degrees): 40 Right Knee Extension (Degrees): 10  Post Interventions Patient Tolerated: Well Instructions Provided: Adjustment of device Ortho Devices Type of Ortho Device: CPM padding Ortho Device/Splint Location: RLE Ortho Device/Splint Interventions: Ordered, Application, Adjustment   Post Interventions Patient Tolerated: Well Instructions Provided: Adjustment of device CPM applied at 12:54 pm Zuriyah Shatz OTR/L 09/02/2023, 1:02 PM

## 2023-09-02 NOTE — Evaluation (Signed)
 Physical Therapy Evaluation Patient Details Name: Eric Hanna MRN: [id number] DOB: 12/02/1945 Today's Date: 09/02/2023  History of Present Illness  78 yo male s/p R TKA on 09/02/23. PMH: DDD, MI, CAD, WPW syndrome, MI, OSA  Clinical Impression  Pt is s/p TKA resulting in the deficits listed below (see PT Problem List).  Pt amb 45' with RW and CGA for safety. Anticipate steady progress in acute setting.   Pt will benefit from acute skilled PT to increase their independence and safety with mobility to allow discharge.          If plan is discharge home, recommend the following: Assistance with cooking/housework;Help with stairs or ramp for entrance;Assist for transportation   Can travel by private vehicle        Equipment Recommendations None recommended by PT  Recommendations for Other Services       Functional Status Assessment Patient has had a recent decline in their functional status and demonstrates the ability to make significant improvements in function in a reasonable and predictable amount of time.     Precautions / Restrictions Precautions Precautions: Knee;Fall Required Braces or Orthoses: Knee Immobilizer - Right Knee Immobilizer - Right: Discontinue once straight leg raise with < 10 degree lag Restrictions Weight Bearing Restrictions Per Provider Order: No Other Position/Activity Restrictions: WBAT      Mobility  Bed Mobility Overal bed mobility: Needs Assistance Bed Mobility: Supine to Sit     Supine to sit: Contact guard          Transfers Overall transfer level: Needs assistance Equipment used: Rolling walker (2 wheels) Transfers: Sit to/from Stand Sit to Stand: Contact guard assist           General transfer comment: cues for hand placement and RLE position    Ambulation/Gait      Pt amb 45' with CGA for safety. Cues for sequence and RW position            Stairs            Wheelchair Mobility     Tilt Bed     Modified Rankin (Stroke Patients Only)       Balance                                             Pertinent Vitals/Pain Pain Assessment Pain Assessment: 0-10 Pain Score: 3  Pain Location: right knee Pain Descriptors / Indicators: Aching, Sore Pain Intervention(s): Limited activity within patient's tolerance, Monitored during session, Premedicated before session, Ice applied, Repositioned    Home Living Family/patient expects to be discharged to:: Private residence Living Arrangements: Spouse/significant other Available Help at Discharge: Family Type of Home: House Home Access: Level entry       Home Layout: One level Home Equipment: Agricultural consultant (2 wheels);BSC/3in1;Shower seat;Other (comment);Rollator (4 wheels);Wheelchair - manual;Cane - single point Additional Comments: ice machine    Prior Function Prior Level of Function : Independent/Modified Independent             Mobility Comments: ind, cane occasionally       Extremity/Trunk Assessment   Upper Extremity Assessment Upper Extremity Assessment: Overall WFL for tasks assessed    Lower Extremity Assessment Lower Extremity Assessment: RLE deficits/detail RLE Deficits / Details: ankle WFL, knee extension and hip flexion 2+/5       Communication   Communication Communication: No apparent  difficulties    Cognition Arousal: Alert Behavior During Therapy: WFL for tasks assessed/performed   PT - Cognitive impairments: No apparent impairments                         Following commands: Intact       Cueing Cueing Techniques: Verbal cues     General Comments      Exercises Total Joint Exercises Ankle Circles/Pumps: AROM, 10 reps, Both Quad Sets: AROM, 5 reps, Both   Assessment/Plan    PT Assessment Patient needs continued PT services  PT Problem List Decreased strength;Decreased range of motion;Decreased activity tolerance;Decreased mobility;Decreased  balance;Pain;Decreased knowledge of use of DME       PT Treatment Interventions DME instruction;Therapeutic exercise;Gait training;Functional mobility training;Therapeutic activities;Patient/family education    PT Goals (Current goals can be found in the Care Plan section)  Acute Rehab PT Goals PT Goal Formulation: With patient Time For Goal Achievement: 09/09/23 Potential to Achieve Goals: Good    Frequency 7X/week     Co-evaluation               AM-PAC PT "6 Clicks" Mobility  Outcome Measure Help needed turning from your back to your side while in a flat bed without using bedrails?: A Little Help needed moving from lying on your back to sitting on the side of a flat bed without using bedrails?: A Little Help needed moving to and from a bed to a chair (including a wheelchair)?: A Little Help needed standing up from a chair using your arms (e.g., wheelchair or bedside chair)?: A Little Help needed to walk in hospital room?: A Little Help needed climbing 3-5 steps with a railing? : A Little 6 Click Score: 18    End of Session Equipment Utilized During Treatment: Gait belt Activity Tolerance: Patient tolerated treatment well Patient left: in chair;with family/visitor present;with chair alarm set Nurse Communication: Mobility status PT Visit Diagnosis: Difficulty in walking, not elsewhere classified (R26.2)    Time: 4098-1191 PT Time Calculation (min) (ACUTE ONLY): 16 min   Charges:   PT Evaluation $PT Eval Low Complexity: 1 Low   PT General Charges $$ ACUTE PT VISIT: 1 Visit         Terilyn Sano, PT  Acute Rehab Dept [facility]) [phone]  09/02/2023   [facility] 09/02/2023, 4:46 PM

## 2023-09-02 NOTE — Anesthesia Postprocedure Evaluation (Signed)
 Anesthesia Post Note  Patient: Eric Hanna  Procedure(s) Performed: RIGHT TOTAL KNEE ARTHROPLASTY (Right: Knee)     Patient location during evaluation: PACU Anesthesia Type: Spinal and Regional Level of consciousness: oriented and awake and alert Pain management: pain level controlled Vital Signs Assessment: post-procedure vital signs reviewed and stable Respiratory status: spontaneous breathing and respiratory function stable Cardiovascular status: blood pressure returned to baseline and stable Postop Assessment: no headache, no backache, no apparent nausea or vomiting, spinal receding and patient able to bend at knees Anesthetic complications: no  No notable events documented.  Last Vitals:  Vitals:   09/02/23 1315 09/02/23 1401  BP: (!) 106/94 125/76  Pulse: (!) 50 (!) 57  Resp: 12 17  Temp:  36.5 C  SpO2: 100%     Last Pain:  Vitals:   09/02/23 1401  TempSrc: Oral  PainSc:                  Gabryel Files,W. EDMOND

## 2023-09-02 NOTE — Progress Notes (Signed)
 Orthopedic Tech Progress Note Patient Details:  Eric Hanna 1946-02-17 [id number]  Patient ID: Eric Hanna, male   DOB: 11-23-1945, 78 y.o.   MRN: [id number] Per pt therapy removed him from CPM around 4:00 pm  Hildegard Hlavac OTR/L 09/02/2023, 5:04 PM

## 2023-09-02 NOTE — Discharge Instructions (Addendum)
 Eric Rei, MD Total Joint Specialist [facility] [address] [place], [place] [address] [phone]  TOTAL KNEE REPLACEMENT POSTOPERATIVE DIRECTIONS    Knee Rehabilitation, Guidelines Following Surgery  Results after knee surgery are often greatly improved when you follow the exercise, range of motion and muscle strengthening exercises prescribed by your doctor. Safety measures are also important to protect the knee from further injury. If any of these exercises cause you to have increased pain or swelling in your knee joint, decrease the amount until you are comfortable again and slowly increase them. If you have problems or questions, call your caregiver or physical therapist for advice.   BLOOD CLOT PREVENTION Take a 10 mg Xarelto once a day for three weeks following surgery. Then take an 81 mg Aspirin  once a day for three weeks. Then discontinue Aspirin . You may resume your vitamins/supplements once you have discontinued the Xarelto. Do not take any NSAIDs (Advil , Aleve, Ibuprofen , Meloxicam, etc.) until you have discontinued the Xarelto.   HOME CARE INSTRUCTIONS  Remove items at home which could result in a fall. This includes throw rugs or furniture in walking pathways.  ICE to the affected knee as much as tolerated. Icing helps control swelling. If the swelling is well controlled you will be more comfortable and rehab easier. Continue to use ice on the knee for pain and swelling from surgery. You may notice swelling that will progress down to the foot and ankle. This is normal after surgery. Elevate the leg when you are not up walking on it.    Continue to use the breathing machine which will help keep your temperature down. It is common for your temperature to cycle up and down following surgery, especially at night when you are not up moving around and exerting yourself. The breathing machine keeps your lungs expanded and your temperature  down. Do not place pillow under the operative knee, focus on keeping the knee straight while resting  DIET You may resume your previous home diet once you are discharged from the hospital.  DRESSING / WOUND CARE / SHOWERING Keep your bulky bandage on for 2 days. On the third post-operative day you may remove the Ace bandage and gauze. There is a waterproof adhesive bandage on your skin which will stay in place until your first follow-up appointment. Once you remove this you will not need to place another bandage You may begin showering 3 days following surgery, but do not submerge the incision under water.  ACTIVITY For the first 5 days, the key is rest and control of pain and swelling Do your home exercises twice a day starting on post-operative day 3. On the days you go to physical therapy, just do the home exercises once that day. You should rest, ice and elevate the leg for 50 minutes out of every hour. Get up and walk/stretch for 10 minutes per hour. After 5 days you can increase your activity slowly as tolerated. Walk with your walker as instructed. Use the walker until you are comfortable transitioning to a cane. Walk with the cane in the opposite hand of the operative leg. You may discontinue the cane once you are comfortable and walking steadily. Avoid periods of inactivity such as sitting longer than an hour when not asleep. This helps prevent blood clots.  You may discontinue the knee immobilizer once you are able to perform a straight leg raise while lying down. You may resume a sexual relationship in one month or  when given the OK by your doctor.  You may return to work once you are cleared by your doctor.  Do not drive a car for 6 weeks or until released by your surgeon.  Do not drive while taking narcotics.  TED HOSE STOCKINGS Wear the elastic stockings on both legs for three weeks following surgery during the day. You may remove them at night for sleeping.  WEIGHT  BEARING Weight bearing as tolerated with assist device (walker, cane, etc) as directed, use it as long as suggested by your surgeon or therapist, typically at least 4-6 weeks.  POSTOPERATIVE CONSTIPATION PROTOCOL Constipation - defined medically as fewer than three stools per week and severe constipation as less than one stool per week.  One of the most common issues patients have following surgery is constipation.  Even if you have a regular bowel pattern at home, your normal regimen is likely to be disrupted due to multiple reasons following surgery.  Combination of anesthesia, postoperative narcotics, change in appetite and fluid intake all can affect your bowels.  In order to avoid complications following surgery, here are some recommendations in order to help you during your recovery period.  Colace (docusate) - Pick up an over-the-counter form of Colace or another stool softener and take twice a day as long as you are requiring postoperative pain medications.  Take with a full glass of water daily.  If you experience loose stools or diarrhea, hold the colace until you stool forms back up. If your symptoms do not get better within 1 week or if they get worse, check with your doctor. Dulcolax (bisacodyl) - Pick up over-the-counter and take as directed by the product packaging as needed to assist with the movement of your bowels.  Take with a full glass of water.  Use this product as needed if not relieved by Colace only.  MiraLax  (polyethylene glycol) - Pick up over-the-counter to have on hand. MiraLax  is a solution that will increase the amount of water in your bowels to assist with bowel movements.  Take as directed and can mix with a glass of water, juice, soda, coffee, or tea. Take if you go more than two days without a movement. Do not use MiraLax  more than once per day. Call your doctor if you are still constipated or irregular after using this medication for 7 days in a row.  If you continue  to have problems with postoperative constipation, please contact the office for further assistance and recommendations.  If you experience "the worst abdominal pain ever" or develop nausea or vomiting, please contact the office immediatly for further recommendations for treatment.  ITCHING If you experience itching with your medications, try taking only a single pain pill, or even half a pain pill at a time.  You can also use Benadryl over the counter for itching or also to help with sleep.   MEDICATIONS See your medication summary on the "After Visit Summary" that the nursing staff will review with you prior to discharge.  You may have some home medications which will be placed on hold until you complete the course of blood thinner medication.  It is important for you to complete the blood thinner medication as prescribed by your surgeon.  Continue your approved medications as instructed at time of discharge.  PRECAUTIONS If you experience chest pain or shortness of breath - call 911 immediately for transfer to the hospital emergency department.  If you develop a fever greater that 101 F, purulent  drainage from wound, increased redness or drainage from wound, foul odor from the wound/dressing, or calf pain - CONTACT YOUR SURGEON.                                                   FOLLOW-UP APPOINTMENTS Make sure you keep all of your appointments after your operation with your surgeon and caregivers. You should call the office at the above phone number and make an appointment for approximately two weeks after the date of your surgery or on the date instructed by your surgeon outlined in the "After Visit Summary".  RANGE OF MOTION AND STRENGTHENING EXERCISES  Rehabilitation of the knee is important following a knee injury or an operation. After just a few days of immobilization, the muscles of the thigh which control the knee become weakened and shrink (atrophy). Knee exercises are designed to build up  the tone and strength of the thigh muscles and to improve knee motion. Often times heat used for twenty to thirty minutes before working out will loosen up your tissues and help with improving the range of motion but do not use heat for the first two weeks following surgery. These exercises can be done on a training (exercise) mat, on the floor, on a table or on a bed. Use what ever works the best and is most comfortable for you Knee exercises include:  Leg Lifts - While your knee is still immobilized in a splint or cast, you can do straight leg raises. Lift the leg to 60 degrees, hold for 3 sec, and slowly lower the leg. Repeat 10-20 times 2-3 times daily. Perform this exercise against resistance later as your knee gets better.  Quad and Hamstring Sets - Tighten up the muscle on the front of the thigh (Quad) and hold for 5-10 sec. Repeat this 10-20 times hourly. Hamstring sets are done by pushing the foot backward against an object and holding for 5-10 sec. Repeat as with quad sets.  Leg Slides: Lying on your back, slowly slide your foot toward your buttocks, bending your knee up off the floor (only go as far as is comfortable). Then slowly slide your foot back down until your leg is flat on the floor again. Angel Wings: Lying on your back spread your legs to the side as far apart as you can without causing discomfort.  A rehabilitation program following serious knee injuries can speed recovery and prevent re-injury in the future due to weakened muscles. Contact your doctor or a physical therapist for more information on knee rehabilitation.   POST-OPERATIVE OPIOID TAPER INSTRUCTIONS: It is important to wean off of your opioid medication as soon as possible. If you do not need pain medication after your surgery it is ok to stop day one. Opioids include: Codeine, Hydrocodone (Norco, Vicodin), Oxycodone (Percocet, oxycontin ) and hydromorphone amongst others.  Long term and even short term use of opiods can  cause: Increased pain response Dependence Constipation Depression Respiratory depression And more.  Withdrawal symptoms can include Flu like symptoms Nausea, vomiting And more Techniques to manage these symptoms Hydrate well Eat regular healthy meals Stay active Use relaxation techniques(deep breathing, meditating, yoga) Do Not substitute Alcohol  to help with tapering If you have been on opioids for less than two weeks and do not have pain than it is ok to stop all together.  Plan to  wean off of opioids This plan should start within one week post op of your joint replacement. Maintain the same interval or time between taking each dose and first decrease the dose.  Cut the total daily intake of opioids by one tablet each day Next start to increase the time between doses. The last dose that should be eliminated is the evening dose.   IF YOU ARE TRANSFERRED TO A SKILLED REHAB FACILITY If the patient is transferred to a skilled rehab facility following release from the hospital, a list of the current medications will be sent to the facility for the patient to continue.  When discharged from the skilled rehab facility, please have the facility set up the patient's Home Health Physical Therapy prior to being released. Also, the skilled facility will be responsible for providing the patient with their medications at time of release from the facility to include their pain medication, the muscle relaxants, and their blood thinner medication. If the patient is still at the rehab facility at time of the two week follow up appointment, the skilled rehab facility will also need to assist the patient in arranging follow up appointment in our office and any transportation needs.  MAKE SURE YOU:  Understand these instructions.  Get help right away if you are not doing well or get worse.   DENTAL ANTIBIOTICS:  In most cases prophylactic antibiotics for Dental procdeures after total joint surgery are  not necessary.  Exceptions are as follows:  1. History of prior total joint infection  2. Severely immunocompromised (Organ Transplant, cancer chemotherapy, Rheumatoid biologic meds such as Humera)  3. Poorly controlled diabetes (A1C &gt; 8.0, blood glucose over 200)  If you have one of these conditions, contact your surgeon for an antibiotic prescription, prior to your dental procedure.    Pick up stool softner and laxative for home use following surgery while on pain medications. Do not submerge incision under water. Please use good hand washing techniques while changing dressing each day. May shower starting three days after surgery. Please use a clean towel to pat the incision dry following showers. Continue to use ice for pain and swelling after surgery. Do not use any lotions or creams on the incision until instructed by your surgeon.   _______________________________________________________________  Information on my medicine - XARELTO (Rivaroxaban)  This medication education was reviewed with me or my healthcare representative as part of my discharge preparation.    Why was Xarelto prescribed for you? Xarelto was prescribed for you to reduce the risk of blood clots forming after orthopedic surgery. The medical term for these abnormal blood clots is venous thromboembolism (VTE).  What do you need to know about xarelto ? Take your Xarelto ONCE DAILY at the same time every day. You may take it either with or without food.  If you have difficulty swallowing the tablet whole, you may crush it and mix in applesauce just prior to taking your dose.  Take Xarelto exactly as prescribed by your doctor and DO NOT stop taking Xarelto without talking to the doctor who prescribed the medication.  Stopping without other VTE prevention medication to take the place of Xarelto may increase your risk of developing a clot.  After discharge, you should have regular check-up  appointments with your healthcare provider that is prescribing your Xarelto.    What do you do if you miss a dose? If you miss a dose, take it as soon as you remember on the same day then continue  your regularly scheduled once daily regimen the next day. Do not take two doses of Xarelto on the same day.   Important Safety Information A possible side effect of Xarelto is bleeding. You should call your healthcare provider right away if you experience any of the following: Bleeding from an injury or your nose that does not stop. Unusual colored urine (red or dark brown) or unusual colored stools (red or black). Unusual bruising for unknown reasons. A serious fall or if you hit your head (even if there is no bleeding).  Some medicines may interact with Xarelto and might increase your risk of bleeding while on Xarelto. To help avoid this, consult your healthcare provider or pharmacist prior to using any new prescription or non-prescription medications, including herbals, vitamins, non-steroidal anti-inflammatory drugs (NSAIDs) and supplements.  This website has more information on Xarelto: VisitDestination.com.br.

## 2023-09-02 NOTE — Progress Notes (Signed)
 Pt has home mask and tubing w/out machine. RT offered to bring pt cpap that is hospital supplied and pt declined use at this time. Pt states he will be fine without it. RT verbalized to pt if he changes his mind to please contact respiratory so that we may assist him w/his cpap needs while here at the hospital. Pt verbalized understanding.    09/02/23 2215  BiPAP/CPAP/SIPAP  Reason BIPAP/CPAP not in use Other(comment) (pt declined use at this time)  Patient Home Mask Yes  Patient Home Tubing Yes  BiPAP/CPAP /SiPAP Vitals  Temp 98 F (36.7 C)  Pulse Rate 64  Resp 17  BP (!) 146/69  SpO2 98 %  MEWS Score/Color  MEWS Score 0  MEWS Score Color Marrie Sizer

## 2023-09-02 NOTE — Anesthesia Procedure Notes (Signed)
 Anesthesia Regional Block: Adductor canal block   Pre-Anesthetic Checklist: , timeout performed,  Correct Patient, Correct Site, Correct Laterality,  Correct Procedure, Correct Position, site marked,  Risks and benefits discussed,  Pre-op evaluation,  At surgeon's request and post-op pain management  Laterality: Right  Prep: Maximum Sterile Barrier Precautions used, chloraprep       Needles:  Injection technique: Single-shot  Needle Type: Echogenic Stimulator Needle     Needle Length: 9cm  Needle Gauge: 21     Additional Needles:   Procedures:,,,, ultrasound used (permanent image in chart),,    Narrative:  Start time: 09/02/2023 9:26 AM End time: 09/02/2023 9:36 AM Injection made incrementally with aspirations every 5 mL.  Performed by: Personally  Anesthesiologist: Jake Mayers, MD  Additional Notes:

## 2023-09-02 NOTE — Anesthesia Procedure Notes (Signed)
 Spinal  Patient location during procedure: OR Start time: 09/02/2023 10:21 AM End time: 09/02/2023 10:23 AM Reason for block: surgical anesthesia Staffing Performed: anesthesiologist  Anesthesiologist: Jake Mayers, MD Performed by: Jake Mayers, MD Authorized by: Jake Mayers, MD   Preanesthetic Checklist Completed: patient identified, IV checked, risks and benefits discussed, surgical consent, monitors and equipment checked, pre-op evaluation and timeout performed Spinal Block Patient position: sitting Prep: DuraPrep Patient monitoring: cardiac monitor, continuous pulse ox and blood pressure Approach: right paramedian Location: L3-4 Injection technique: single-shot Needle Needle type: Quincke  Needle gauge: 22 G Needle length: 9 cm Assessment Sensory level: T8 Events: CSF return and second provider Additional Notes Functioning IV was confirmed and monitors were applied. Sterile prep and drape, including hand hygiene and sterile gloves were used. The patient was positioned and the spine was prepped. The skin was anesthetized with lidocaine .  Free flow of clear CSF was obtained prior to injecting local anesthetic into the CSF.  The spinal needle aspirated freely following injection.  The needle was carefully withdrawn.  The patient tolerated the procedure well.

## 2023-09-03 ENCOUNTER — Other Ambulatory Visit (HOSPITAL_COMMUNITY): Payer: Self-pay

## 2023-09-03 ENCOUNTER — Encounter (HOSPITAL_COMMUNITY): Payer: Self-pay | Admitting: Orthopedic Surgery

## 2023-09-03 DIAGNOSIS — M1711 Unilateral primary osteoarthritis, right knee: Secondary | ICD-10-CM | POA: Diagnosis not present

## 2023-09-03 LAB — CBC
HCT: 37 % — ABNORMAL LOW (ref 39.0–52.0)
Hemoglobin: 12 g/dL — ABNORMAL LOW (ref 13.0–17.0)
MCH: 30.2 pg (ref 26.0–34.0)
MCHC: 32.4 g/dL (ref 30.0–36.0)
MCV: 93.2 fL (ref 80.0–100.0)
Platelets: 132 10*3/uL — ABNORMAL LOW (ref 150–400)
RBC: 3.97 MIL/uL — ABNORMAL LOW (ref 4.22–5.81)
RDW: 13.4 % (ref 11.5–15.5)
WBC: 10.1 10*3/uL (ref 4.0–10.5)
nRBC: 0 % (ref 0.0–0.2)

## 2023-09-03 LAB — BASIC METABOLIC PANEL WITH GFR
Anion gap: 7 (ref 5–15)
BUN: 21 mg/dL (ref 8–23)
CO2: 26 mmol/L (ref 22–32)
Calcium: 8.8 mg/dL — ABNORMAL LOW (ref 8.9–10.3)
Chloride: 108 mmol/L (ref 98–111)
Creatinine, Ser: 0.76 mg/dL (ref 0.61–1.24)
GFR, Estimated: >60 mL/min (ref >60)
Glucose, Bld: 126 mg/dL — ABNORMAL HIGH (ref 70–99)
Potassium: 4.1 mmol/L (ref 3.5–5.1)
Sodium: 141 mmol/L (ref 135–145)

## 2023-09-03 MED ORDER — RIVAROXABAN 10 MG PO TABS
10.0000 mg | ORAL_TABLET | Freq: Every day | ORAL | 0 refills | Status: AC
Start: 2023-09-03 — End: 2023-09-23
  Filled 2023-09-03: qty 20, 20d supply, fill #0

## 2023-09-03 MED ORDER — ONDANSETRON HCL 4 MG PO TABS: 4.0000 mg | ORAL_TABLET | Freq: Four times a day (QID) | ORAL | 0 refills | Status: DC | PRN

## 2023-09-03 MED ORDER — METHOCARBAMOL 500 MG PO TABS: 500.0000 mg | ORAL_TABLET | Freq: Four times a day (QID) | ORAL | 0 refills | Status: DC | PRN

## 2023-09-03 MED ORDER — OXYCODONE HCL 5 MG PO TABS
5.0000 mg | ORAL_TABLET | Freq: Four times a day (QID) | ORAL | 0 refills | Status: DC | PRN
Start: 2023-09-03 — End: 2023-09-03

## 2023-09-03 MED ORDER — OXYCODONE HCL 5 MG PO TABS
5.0000 mg | ORAL_TABLET | Freq: Four times a day (QID) | ORAL | 0 refills | Status: AC | PRN
  Filled 2023-09-03: qty 42, 7d supply, fill #0

## 2023-09-03 MED ORDER — METHOCARBAMOL 500 MG PO TABS
500.0000 mg | ORAL_TABLET | Freq: Four times a day (QID) | ORAL | 0 refills | Status: AC | PRN
  Filled 2023-09-03: qty 40, 10d supply, fill #0

## 2023-09-03 MED ORDER — RIVAROXABAN 10 MG PO TABS: 10.0000 mg | ORAL_TABLET | Freq: Every day | ORAL | 0 refills | Status: DC

## 2023-09-03 MED ORDER — ONDANSETRON HCL 4 MG PO TABS
4.0000 mg | ORAL_TABLET | Freq: Four times a day (QID) | ORAL | 0 refills | Status: AC | PRN
  Filled 2023-09-03: qty 20, 5d supply, fill #0

## 2023-09-03 NOTE — Care Management Obs Status (Signed)
 MEDICARE OBSERVATION STATUS NOTIFICATION   Patient Details  Name: Eric Hanna MRN: [id number] Date of Birth: [birth year]   Medicare Observation Status Notification Given:  Birda Buffy, LCSW 09/03/2023, 11:39 AM

## 2023-09-03 NOTE — Progress Notes (Signed)
 Subjective: 1 Day Post-Op Procedure(s) (LRB): RIGHT TOTAL KNEE ARTHROPLASTY (Right) Patient reports pain as mild.   Patient seen in rounds by Dr. France Ina. Patient is well, and has had no acute complaints or problems No issues overnight. Denies chest pain, SOB, or calf pain. Foley catheter removed this AM.  We will continue therapy today, ambulated 45' yesterday.   Objective: Vital signs in last 24 hours: Temp:  [97.1 F (36.2 C)-98.2 F (36.8 C)] 98.1 F (36.7 C) (04/29 0503) Pulse Rate:  [50-73] 70 (04/29 0503) Resp:  [9-18] 17 (04/29 0503) BP: (106-146)/(64-94) 114/66 (04/29 0503) SpO2:  [95 %-100 %] 95 % (04/29 0503) Weight:  [102.5 kg] 102.5 kg (04/28 0810)  Intake/Output from previous day:  Intake/Output Summary (Last 24 hours) at 09/03/2023 0749 Last data filed at 09/03/2023 0620 Gross per 24 hour  Intake 3289.1 ml  Output 4005 ml  Net -715.9 ml     Intake/Output this shift: No intake/output data recorded.  Labs: Recent Labs    09/03/23 0317  HGB 12.0*   Recent Labs    09/03/23 0317  WBC 10.1  RBC 3.97*  HCT 37.0*  PLT 132*   Recent Labs    09/03/23 0317  NA 141  K 4.1  CL 108  CO2 26  BUN 21  CREATININE 0.76  GLUCOSE 126*  CALCIUM  8.8*   No results for input(s): "LABPT", "INR" in the last 72 hours.  Exam: General - Patient is Alert and Oriented Extremity - Neurologically intact Neurovascular intact Sensation intact distally Dorsiflexion/Plantar flexion intact Dressing - dressing C/D/I Motor Function - intact, moving foot and toes well on exam.   Past Medical History:  Diagnosis Date   Asthma    as a child   Basal cell carcinoma 04/16/2022   L neck - Excised 05/29/22   Coronary artery disease    DDD (degenerative disc disease), cervical    Depression    Diabetes mellitus without complication (HCC)    borderline; no meds; no blood sugar checks   Diarrhea    GERD (gastroesophageal reflux disease)    History of kidney stones     Hypertension    Hypertrophy of prostate    Impotence, organic    Lower urinary tract symptoms (LUTS)    Lumbar trigger point syndrome    Malignant neoplasm of bladder (HCC)    Myocardial infarction (HCC) 2000   Obesity    OSA (obstructive sleep apnea)    CPAP   Osteoarthritis    Sleep apnea    Spondylolisthesis of cervical region    Testicular hypofunction    WPW (Wolff-Parkinson-White syndrome)     Assessment/Plan: 1 Day Post-Op Procedure(s) (LRB): RIGHT TOTAL KNEE ARTHROPLASTY (Right) Principal Problem:   OA (osteoarthritis) of knee Active Problems:   Primary osteoarthritis of right knee  Estimated body mass index is 29.82 kg/m as calculated from the following:   Height as of this encounter: 6\' 1"  (1.854 m).   Weight as of this encounter: 102.5 kg. Advance diet Up with therapy D/C IV fluids  Patient's anticipated LOS is less than 2 midnights, meeting these requirements: - Lives within 1 hour of care - Has a competent adult at home to recover with post-op recover - NO history of             - Chronic pain requiring opioids             - Diabetes             -  Coronary Artery Disease             - Heart failure             - Heart attack             - Stroke             - DVT/VTE             - Cardiac arrhythmia             - Respiratory Failure/COPD             - Renal failure             - Anemia             - Advanced Liver disease  DVT Prophylaxis - Xarelto Weight bearing as tolerated. Continue therapy.  Plan is to go Home after hospital stay. Plan for discharge later today if progresses with therapy and meeting goals. Scheduled for OPP[facility] at [facility]). Follow-up in the office in 2 weeks.  The PDMP database was reviewed today prior to any opioid medications being prescribed to this patient.  Sharlynn Dear, PA-C Orthopedic Surgery [phone] 09/03/2023, 7:49 AM

## 2023-09-03 NOTE — Progress Notes (Signed)
 TOC meds in a secure bag delivered to pt by this RN

## 2023-09-03 NOTE — TOC Transition Note (Signed)
 Transition of Care [facility]) - Discharge Note   Patient Details  Name: YUSHA WIESS MRN: [id number] Date of Birth: 1945-12-30  Transition of [facility]) CM/SW Contact:  Delilah Fend, LCSW Phone Number: 09/03/2023, 10:32 AM   Clinical Narrative:     Met with pt who confirms he has needed DME in the home.  OPPT already arranged with [facility]).  No further TOC needs.  Final next level of care: OP Rehab Barriers to Discharge: No Barriers Identified   Patient Goals and CMS Choice Patient states their goals for this hospitalization and ongoing recovery are:: return home          Discharge Placement                       Discharge Plan and Services Additional resources added to the After Visit Summary for                  DME Arranged: N/A DME Agency: NA                  Social Drivers of Health (SDOH) Interventions SDOH Screenings   Food Insecurity: No Food Insecurity (09/02/2023)  Housing: Low Risk  (09/02/2023)  Transportation Needs: No Transportation Needs (09/02/2023)  Utilities: Not At Risk (09/02/2023)  Depression (PHQ2-9): Medium Risk (06/27/2021)  Financial Resource Strain: Low Risk  (06/14/2023)   Received from [facility]  Social Connections: Moderately Integrated (09/02/2023)  Tobacco Use: Medium Risk (09/02/2023)     Readmission Risk Interventions     No data to display

## 2023-09-03 NOTE — Progress Notes (Signed)
 Physical Therapy Treatment Patient Details Name: Eric Hanna MRN: [id number] DOB: 06-12-1945 Today's Date: 09/03/2023   History of Present Illness 78 yo male s/p R TKA on 09/02/23. PMH: DDD, MI, CAD, WPW syndrome, MI, OSA    PT Comments  Pt progressing well, meeting goals and is ready for d/c from PT standpoint. Wife supportive, present for session and able to assist as needed    If plan is discharge home, recommend the following: Assistance with cooking/housework;Help with stairs or ramp for entrance;Assist for transportation   Can travel by private vehicle        Equipment Recommendations  None recommended by PT    Recommendations for Other Services       Precautions / Restrictions Precautions Precautions: Knee;Fall Precaution Booklet Issued: No Recall of Precautions/Restrictions: Intact Restrictions Weight Bearing Restrictions Per Provider Order: No Other Position/Activity Restrictions: WBAT     Mobility  Bed Mobility               General bed mobility comments: in recliner    Transfers Overall transfer level: Needs assistance Equipment used: Rolling walker (2 wheels) Transfers: Sit to/from Stand Sit to Stand: Supervision           General transfer comment: cues for hand placement and RLE position    Ambulation/Gait Ambulation/Gait assistance: Supervision, Contact guard assist Gait Distance (Feet): 50 Feet Assistive device: Rolling walker (2 wheels) Gait Pattern/deviations: Step-to pattern, Decreased stance time - right, Decreased step length - right, Decreased step length - left       General Gait Details: cues for sequence and RW position   Stairs             Wheelchair Mobility     Tilt Bed    Modified Rankin (Stroke Patients Only)       Balance                                            Communication Communication Communication: No apparent difficulties  Cognition Arousal: Alert Behavior During  Therapy: WFL for tasks assessed/performed   PT - Cognitive impairments: No apparent impairments                         Following commands: Intact      Cueing Cueing Techniques: Verbal cues  Exercises Total Joint Exercises Ankle Circles/Pumps: AROM, 10 reps, Both Quad Sets: AROM, Both, 10 reps Heel Slides: AAROM, Right, 10 reps Straight Leg Raises: AAROM, Strengthening, Right, 10 reps    General Comments        Pertinent Vitals/Pain Pain Assessment Pain Assessment: 0-10 Pain Score: 6  Pain Location: right knee Pain Descriptors / Indicators: Aching, Sore Pain Intervention(s): Limited activity within patient's tolerance, Monitored during session, Premedicated before session, Repositioned    Home Living                          Prior Function            PT Goals (current goals can now be found in the care plan section) Acute Rehab PT Goals PT Goal Formulation: With patient Time For Goal Achievement: 09/09/23 Potential to Achieve Goals: Good Progress towards PT goals: Progressing toward goals    Frequency    7X/week      PT Plan  Co-evaluation              AM-PAC PT "6 Clicks" Mobility   Outcome Measure  Help needed turning from your back to your side while in a flat bed without using bedrails?: A Little Help needed moving from lying on your back to sitting on the side of a flat bed without using bedrails?: A Little Help needed moving to and from a bed to a chair (including a wheelchair)?: A Little Help needed standing up from a chair using your arms (e.g., wheelchair or bedside chair)?: A Little Help needed to walk in hospital room?: A Little Help needed climbing 3-5 steps with a railing? : A Little 6 Click Score: 18    End of Session Equipment Utilized During Treatment: Gait belt Activity Tolerance: Patient tolerated treatment well Patient left: in chair;with family/visitor present;with chair alarm set Nurse  Communication: Mobility status PT Visit Diagnosis: Difficulty in walking, not elsewhere classified (R26.2)     Time: 1610-9604 PT Time Calculation (min) (ACUTE ONLY): 24 min  Charges:    $Gait Training: 8-22 mins $Therapeutic Exercise: 8-22 mins PT General Charges $$ ACUTE PT VISIT: 1 Visit                     Alexandria Ida, PT  Acute Rehab Dept [facility]) [phone]  09/03/2023    [facility] 09/03/2023, 10:17 AM

## 2023-09-04 ENCOUNTER — Other Ambulatory Visit (HOSPITAL_COMMUNITY): Payer: Self-pay

## 2023-09-04 NOTE — Discharge Summary (Signed)
 Patient ID: Eric Hanna MRN: [id number] DOB/AGE: 11/07/1945 78 y.o.  Admit date: 09/02/2023 Discharge date: 09/03/2023  Admission Diagnoses:  Principal Problem:   OA (osteoarthritis) of knee Active Problems:   Primary osteoarthritis of right knee   Discharge Diagnoses:  Same  Past Medical History:  Diagnosis Date   Asthma    as a child   Basal cell carcinoma 04/16/2022   L neck - Excised 05/29/22   Coronary artery disease    DDD (degenerative disc disease), cervical    Depression    Diabetes mellitus without complication (HCC)    borderline; no meds; no blood sugar checks   Diarrhea    GERD (gastroesophageal reflux disease)    History of kidney stones    Hypertension    Hypertrophy of prostate    Impotence, organic    Lower urinary tract symptoms (LUTS)    Lumbar trigger point syndrome    Malignant neoplasm of bladder (HCC)    Myocardial infarction (HCC) 2000   Obesity    OSA (obstructive sleep apnea)    CPAP   Osteoarthritis    Sleep apnea    Spondylolisthesis of cervical region    Testicular hypofunction    WPW (Wolff-Parkinson-White syndrome)     Surgeries: Procedure(s): RIGHT TOTAL KNEE ARTHROPLASTY on 09/02/2023   Consultants:   Discharged Condition: Improved  Hospital Course: SHUFORD BYWATERS is an 78 y.o. male who was admitted 09/02/2023 for operative treatment ofOA (osteoarthritis) of knee. Patient has severe unremitting pain that affects sleep, daily activities, and work/hobbies. After pre-op clearance the patient was taken to the operating room on 09/02/2023 and underwent  Procedure(s): RIGHT TOTAL KNEE ARTHROPLASTY.    Patient was given perioperative antibiotics:  Anti-infectives (From admission, onward)    Start     Dose/Rate Route Frequency Ordered Stop   09/02/23 1630  ceFAZolin (ANCEF) IVPB 2g/100 mL premix        2 g 200 mL/hr over 30 Minutes Intravenous Every 6 hours 09/02/23 1349 09/03/23 0753   09/02/23 0800  ceFAZolin (ANCEF) IVPB  2g/100 mL premix        2 g 200 mL/hr over 30 Minutes Intravenous On call to O.R. 09/02/23 0752 09/02/23 1024        Patient was given sequential compression devices, early ambulation, and chemoprophylaxis to prevent DVT.  Patient benefited maximally from hospital stay and there were no complications.    Recent vital signs: Patient Vitals for the past 24 hrs:  BP Temp Pulse Resp SpO2  09/03/23 0927 113/70 97.7 F (36.5 C) 63 18 97 %     Recent laboratory studies:  Recent Labs    09/03/23 0317  WBC 10.1  HGB 12.0*  HCT 37.0*  PLT 132*  NA 141  K 4.1  CL 108  CO2 26  BUN 21  CREATININE 0.76  GLUCOSE 126*  CALCIUM  8.8*     Discharge Medications:   Allergies as of 09/03/2023   No Known Allergies      Medication List     STOP taking these medications    aspirin  81 MG tablet   CALCIUM  MAGNESIUM 750 PO   cyanocobalamin 1000 MCG tablet   Fish Oil 1200 MG Cpdr   multivitamin capsule   ondansetron  4 MG disintegrating tablet Commonly known as: ZOFRAN -ODT       TAKE these medications    acetaminophen  650 MG CR tablet Commonly known as: TYLENOL  Take 1,300 mg by mouth in the morning.   alendronate 70  MG tablet Commonly known as: FOSAMAX Take 70 mg by mouth once a week. Take with a full glass of water on an empty stomach.   Allergy Relief 10 MG tablet Generic drug: loratadine Take 10 mg by mouth daily.   methocarbamol 500 MG tablet Commonly known as: ROBAXIN Take 1 tablet (500 mg total) by mouth every 6 (six) hours as needed for muscle spasms.   metoprolol  succinate 25 MG 24 hr tablet Commonly known as: TOPROL -XL Take 25 mg by mouth daily.   omeprazole 20 MG capsule Commonly known as: PRILOSEC Take 20 mg by mouth daily.   ondansetron  4 MG tablet Commonly known as: ZOFRAN  Take 1 tablet (4 mg total) by mouth every 6 (six) hours as needed for nausea.   oxyCODONE  5 MG immediate release tablet Commonly known as: Oxy IR/ROXICODONE  Take 1-2  tablets (5-10 mg total) by mouth every 6 (six) hours as needed for severe pain (pain score 7-10).   sertraline  50 MG tablet Commonly known as: ZOLOFT  Take 1 tablet (50 mg total) by mouth at bedtime.   simvastatin 80 MG tablet Commonly known as: ZOCOR Take 80 mg by mouth daily.   tirzepatide 10 MG/0.5ML Pen Commonly known as: MOUNJARO Inject 10 mg into the skin once a week.   traZODone  50 MG tablet Commonly known as: DESYREL  Take 50 mg by mouth at bedtime.   Xarelto 10 MG Tabs tablet Generic drug: rivaroxaban Take 1 tablet (10 mg total) by mouth daily with breakfast for 20 days. Then resume one 81 mg aspirin  once a day               Discharge Care Instructions  (From admission, onward)           Start     Ordered   09/03/23 0000  Weight bearing as tolerated        09/03/23 0752   09/03/23 0000  Change dressing       Comments: You may remove the bulky bandage (ACE wrap and gauze) two days after surgery. You will have an adhesive waterproof bandage underneath. Leave this in place until your first follow-up appointment.   09/03/23 0752            Diagnostic Studies: No results found.  Disposition: Discharge disposition: 01-Home or Self Care       Discharge Instructions     Call MD / Call 911   Complete by: As directed    If you experience chest pain or shortness of breath, CALL 911 and be transported to the hospital emergency room.  If you develope a fever above 101 F, pus (white drainage) or increased drainage or redness at the wound, or calf pain, call your surgeon's office.   Change dressing   Complete by: As directed    You may remove the bulky bandage (ACE wrap and gauze) two days after surgery. You will have an adhesive waterproof bandage underneath. Leave this in place until your first follow-up appointment.   Constipation Prevention   Complete by: As directed    Drink plenty of fluids.  Prune juice may be helpful.  You may use a stool softener,  such as Colace (over the counter) 100 mg twice a day.  Use MiraLax  (over the counter) for constipation as needed.   Diet - low sodium heart healthy   Complete by: As directed    Do not put a pillow under the knee. Place it under the heel.   Complete by: As directed  Driving restrictions   Complete by: As directed    No driving for two weeks   Post-operative opioid taper instructions:   Complete by: As directed    POST-OPERATIVE OPIOID TAPER INSTRUCTIONS: It is important to wean off of your opioid medication as soon as possible. If you do not need pain medication after your surgery it is ok to stop day one. Opioids include: Codeine, Hydrocodone (Norco, Vicodin), Oxycodone (Percocet, oxycontin ) and hydromorphone amongst others.  Long term and even short term use of opiods can cause: Increased pain response Dependence Constipation Depression Respiratory depression And more.  Withdrawal symptoms can include Flu like symptoms Nausea, vomiting And more Techniques to manage these symptoms Hydrate well Eat regular healthy meals Stay active Use relaxation techniques(deep breathing, meditating, yoga) Do Not substitute Alcohol  to help with tapering If you have been on opioids for less than two weeks and do not have pain than it is ok to stop all together.  Plan to wean off of opioids This plan should start within one week post op of your joint replacement. Maintain the same interval or time between taking each dose and first decrease the dose.  Cut the total daily intake of opioids by one tablet each day Next start to increase the time between doses. The last dose that should be eliminated is the evening dose.      TED hose   Complete by: As directed    Use stockings (TED hose) for three weeks on both leg(s).  You may remove them at night for sleeping.   Weight bearing as tolerated   Complete by: As directed         Follow-up Information     Aluisio, Samuel Crock, MD Follow up in 2  week(s).   Specialty: Orthopedic Surgery Contact information: [address] [place] [address] [phone]                  Signed: Sharlynn Dear 09/04/2023, 8:15 AM

## 2024-04-23 ENCOUNTER — Ambulatory Visit: Payer: Medicare Other | Admitting: Dermatology

## 2024-04-23 ENCOUNTER — Encounter: Payer: Self-pay | Admitting: Dermatology

## 2024-04-23 DIAGNOSIS — W908XXA Exposure to other nonionizing radiation, initial encounter: Secondary | ICD-10-CM

## 2024-04-23 DIAGNOSIS — L814 Other melanin hyperpigmentation: Secondary | ICD-10-CM | POA: Diagnosis not present

## 2024-04-23 DIAGNOSIS — L570 Actinic keratosis: Secondary | ICD-10-CM | POA: Diagnosis not present

## 2024-04-23 DIAGNOSIS — Z85828 Personal history of other malignant neoplasm of skin: Secondary | ICD-10-CM

## 2024-04-23 DIAGNOSIS — Z7189 Other specified counseling: Secondary | ICD-10-CM

## 2024-04-23 DIAGNOSIS — L821 Other seborrheic keratosis: Secondary | ICD-10-CM

## 2024-04-23 DIAGNOSIS — Z1283 Encounter for screening for malignant neoplasm of skin: Secondary | ICD-10-CM

## 2024-04-23 DIAGNOSIS — L578 Other skin changes due to chronic exposure to nonionizing radiation: Secondary | ICD-10-CM | POA: Diagnosis not present

## 2024-04-23 DIAGNOSIS — D1801 Hemangioma of skin and subcutaneous tissue: Secondary | ICD-10-CM

## 2024-04-23 DIAGNOSIS — L719 Rosacea, unspecified: Secondary | ICD-10-CM

## 2024-04-23 DIAGNOSIS — I781 Nevus, non-neoplastic: Secondary | ICD-10-CM | POA: Diagnosis not present

## 2024-04-23 DIAGNOSIS — D229 Melanocytic nevi, unspecified: Secondary | ICD-10-CM

## 2024-04-23 NOTE — Patient Instructions (Addendum)
 To treat vessels and redness on Nose  Counseling for BBL / IPL / Laser and Coordination of Care Discussed the treatment option of Broad Band Light (BBL) /Intense Pulsed Light (IPL)/ Laser for skin discoloration, including brown spots and redness.  Typically we recommend at least 1-3 treatment sessions about 5-8 weeks apart for best results.  Cannot have tanned skin when BBL performed, and regular use of sunscreen/photoprotection is advised after the procedure to help maintain results. The patient's condition may also require maintenance treatments in the future.  The fee for BBL / laser treatments is $350 per treatment session for the whole face.  A fee can be quoted for other parts of the body.  Insurance typically does not pay for BBL/laser treatments and therefore the fee is an out-of-pocket cost. Recommend prophylactic valtrex treatment. Once scheduled for procedure, will send Rx in prior to patient's appointment.    Cryotherapy Aftercare  Wash gently with soap and water  everyday.   Apply Vaseline and Band-Aid daily until healed.   Actinic keratoses are precancerous spots that appear secondary to cumulative UV radiation exposure/sun exposure over time. They are chronic with expected duration over 1 year. A portion of actinic keratoses will progress to squamous cell carcinoma of the skin. It is not possible to reliably predict which spots will progress to skin cancer and so treatment is recommended to prevent development of skin cancer.  Recommend daily broad spectrum sunscreen SPF 30+ to sun-exposed areas, reapply every 2 hours as needed.  Recommend staying in the shade or wearing long sleeves, sun glasses (UVA+UVB protection) and wide brim hats (4-inch brim around the entire circumference of the hat). Call for new or changing lesions.      Melanoma ABCDEs  Melanoma is the most dangerous type of skin cancer, and is the leading cause of death from skin disease.  You are more likely to  develop melanoma if you: Have light-colored skin, light-colored eyes, or red or blond hair Spend a lot of time in the sun Tan regularly, either outdoors or in a tanning bed Have had blistering sunburns, especially during childhood Have a close family member who has had a melanoma Have atypical moles or large birthmarks  Early detection of melanoma is key since treatment is typically straightforward and cure rates are extremely high if we catch it early.   The first sign of melanoma is often a change in a mole or a new dark spot.  The ABCDE system is a way of remembering the signs of melanoma.  A for asymmetry:  The two halves do not match. B for border:  The edges of the growth are irregular. C for color:  A mixture of colors are present instead of an even brown color. D for diameter:  Melanomas are usually (but not always) greater than 6mm - the size of a pencil eraser. E for evolution:  The spot keeps changing in size, shape, and color.  Please check your skin once per month between visits. You can use a small mirror in front and a large mirror behind you to keep an eye on the back side or your body.   If you see any new or changing lesions before your next follow-up, please call to schedule a visit.  Please continue daily skin protection including broad spectrum sunscreen SPF 30+ to sun-exposed areas, reapplying every 2 hours as needed when you're outdoors.   Staying in the shade or wearing long sleeves, sun glasses (UVA+UVB protection) and wide brim  hats (4-inch brim around the entire circumference of the hat) are also recommended for sun protection.     Due to recent changes in healthcare laws, you may see results of your pathology and/or laboratory studies on MyChart before the doctors have had a chance to review them. We understand that in some cases there may be results that are confusing or concerning to you. Please understand that not all results are received at the same time and  often the doctors may need to interpret multiple results in order to provide you with the best plan of care or course of treatment. Therefore, we ask that you please give us  2 business days to thoroughly review all your results before contacting the office for clarification. Should we see a critical lab result, you will be contacted sooner.   If You Need Anything After Your Visit  If you have any questions or concerns for your doctor, please call our main line at [phone] and press option 4 to reach your doctor's medical assistant. If no one answers, please leave a voicemail as directed and we will return your call as soon as possible. Messages left after 4 pm will be answered the following business day.   You may also send us  a message via MyChart. We typically respond to MyChart messages within 1-2 business days.  For prescription refills, please ask your pharmacy to contact our office. Our fax number is [phone].  If you have an urgent issue when the clinic is closed that cannot wait until the next business day, you can page your doctor at the number below.    Please note that while we do our best to be available for urgent issues outside of office hours, we are not available 24/7.   If you have an urgent issue and are unable to reach us , you may choose to seek medical care at your doctor's office, retail clinic, urgent care center, or emergency room.  If you have a medical emergency, please immediately call 911 or go to the emergency department.  Pager Numbers  - Dr. Hester: [phone]  - Dr. Jackquline: [phone]  - Dr. Claudene: [phone]   - Dr. Raymund: [phone]  In the event of inclement weather, please call our main line at [phone] for an update on the status of any delays or closures.  Dermatology Medication Tips: Please keep the boxes that topical medications come in in order to help keep track of the instructions about where and how to use these.  Pharmacies typically print the medication instructions only on the boxes and not directly on the medication tubes.   If your medication is too expensive, please contact our office at [phone] option 4 or send us  a message through MyChart.   We are unable to tell what your co-pay for medications will be in advance as this is different depending on your insurance coverage. However, we may be able to find a substitute medication at lower cost or fill out paperwork to get insurance to cover a needed medication.   If a prior authorization is required to get your medication covered by your insurance company, please allow us  1-2 business days to complete this process.  Drug prices often vary depending on where the prescription is filled and some pharmacies may offer cheaper prices.  The website www.goodrx.com contains coupons for medications through different pharmacies. The prices here do not account for what the cost may be with help from insurance (it may be cheaper with your insurance),  but the website can give you the price if you did not use any insurance.  - You can print the associated coupon and take it with your prescription to the pharmacy.  - You may also stop by our office during regular business hours and pick up a GoodRx coupon card.  - If you need your prescription sent electronically to a different pharmacy, notify our office through [facility] or by phone at [phone] option 4.     Si Usted Necesita Algo Despus de Su Visita  Tambin puede enviarnos un mensaje a travs de Clinical Cytogeneticist. Por lo general respondemos a los mensajes de MyChart en el transcurso de 1 a 2 das hbiles.  Para renovar recetas, por favor pida a su farmacia que se ponga en contacto con nuestra oficina. Randi lakes de fax es [place] [phone].  Si tiene un asunto urgente cuando la clnica est cerrada y que no puede esperar hasta el siguiente da hbil, puede llamar/localizar a su doctor(a) al nmero  que aparece a continuacin.   Por favor, tenga en cuenta que aunque hacemos todo lo posible para estar disponibles para asuntos urgentes fuera del horario de [place], no estamos disponibles las 24 horas del da, los 7 [address]  [address] de la [place].   Si tiene un problema urgente y no puede comunicarse con nosotros, puede optar por buscar atencin mdica  en el consultorio de su doctor(a), en una clnica privada, en un centro de atencin urgente o en una sala de emergencias.  Si tiene engineer, drilling, por favor llame inmediatamente al 911 o vaya a la sala de emergencias.  Nmeros de bper  - Dr. Hester: [phone]  - Dra. Jackquline: [phone]  - Dr. Claudene: [phone]  - Dra. Kitts: [phone]  En caso de inclemencias del [place], por favor llame a nuestra lnea principal al [phone] para una actualizacin sobre el estado de cualquier retraso o cierre.  Consejos para la medicacin en dermatologa: Por favor, guarde las cajas en las que vienen los medicamentos de uso tpico para ayudarle a seguir las instrucciones sobre dnde y cmo usarlos. Las farmacias generalmente imprimen las instrucciones del medicamento slo en las cajas y no directamente en los tubos del [place].   Si su medicamento es muy caro, por favor, pngase en contacto con landry rieger llamando al [phone] y presione la opcin 4 o envenos un mensaje a travs de Clinical Cytogeneticist.   No podemos decirle cul ser su copago por los medicamentos por adelantado ya que esto es diferente dependiendo de la cobertura de su seguro. Sin embargo, es posible que podamos encontrar un medicamento sustituto a audiological scientist un formulario para que el seguro cubra el medicamento que se considera necesario.   Si se requiere una autorizacin previa para que su compaa de seguros cubra su medicamento, por favor permtanos de 1 a 2 das hbiles para completar este proceso.  Los precios de los medicamentos varan con frecuencia  dependiendo del environmental consultant de dnde se surte la receta y alguna farmacias pueden ofrecer precios ms baratos.  El sitio web www.goodrx.com tiene cupones para medicamentos de health and safety inspector. Los precios aqu no tienen en cuenta lo que podra costar con la ayuda del seguro (puede ser ms barato con su seguro), pero el sitio web puede darle el precio si no utiliz tourist information centre manager.  - Puede imprimir el cupn correspondiente y llevarlo con su receta a la farmacia.  - Tambin puede pasar por nuestra oficina durante el horario de atencin regular y  recoger una tarjeta de cupones de GoodRx.  - Si necesita que su receta se enve electrnicamente a una farmacia diferente, informe a nuestra oficina a travs de MyChart de [place] o por telfono llamando al [phone] y presione la opcin 4.

## 2024-04-23 NOTE — Progress Notes (Signed)
 Follow-Up Visit   Subjective  Eric Hanna is a 78 y.o. male who presents for the following: Skin Cancer Screening and Full Body Skin Exam Hx of bcc, hx of aks and isks   The patient presents for Total-Body Skin Exam (TBSE) for skin cancer screening and mole check. The patient has spots, moles and lesions to be evaluated, some may be new or changing and the patient may have concern these could be cancer.  The following portions of the chart were reviewed this encounter and updated as appropriate: medications, allergies, medical history  Review of Systems:  No other skin or systemic complaints except as noted in HPI or Assessment and Plan.  Objective  Well appearing patient in no apparent distress; mood and affect are within normal limits.  A full examination was performed including scalp, head, eyes, ears, nose, lips, neck, chest, axillae, abdomen, back, buttocks, bilateral upper extremities, bilateral lower extremities, hands, feet, fingers, toes, fingernails, and toenails. All findings within normal limits unless otherwise noted below.   Relevant physical exam findings are noted in the Assessment and Plan.  left ear x 1, right ear x 1 (2) Erythematous thin papules/macules with gritty scale.   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  - Call if any new or changing lesions are noted between office visits   SKIN CANCER SCREENING PERFORMED TODAY.  ACTINIC DAMAGE - Chronic condition, secondary to cumulative UV/sun exposure - diffuse scaly erythematous macules with underlying dyspigmentation - Recommend daily broad spectrum sunscreen SPF 30+ to sun-exposed areas, reapply every 2 hours as needed.  - Staying in the shade or wearing  long sleeves, sun glasses (UVA+UVB protection) and wide brim hats (4-inch brim around the entire circumference of the hat) are also recommended for sun protection.  - Call for new or changing lesions.  LENTIGINES, SEBORRHEIC KERATOSES, HEMANGIOMAS - Benign normal skin lesions - Benign-appearing - Call for any changes  MELANOCYTIC NEVI - Tan-brown and/or pink-flesh-colored symmetric macules and papules - Benign appearing on exam today - Observation - Call clinic for new or changing moles - Recommend daily use of broad spectrum spf 30+ sunscreen to sun-exposed areas.    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 Condition is symptomatic/ bothersome to patient. Not currently at goal. Rosacea is a chronic progressive skin condition usually affecting the face of adults, causing redness and/or acne bumps. It is treatable but not curable. It sometimes affects the eyes (ocular rosacea) as well. It may respond to topical and/or systemic medication and can flare with stress, sun exposure, alcohol , exercise, topical steroids (including hydrocortisone/cortisone 10) and some foods.  Daily application of broad spectrum spf 30+ sunscreen to face is recommended to reduce flares.  Patient denies grittiness of the eyes  Treatment Plan Counseling for BBL / IPL / Laser and Coordination of Care Discussed the treatment option of Broad Band Light (BBL) /Intense Pulsed Light (IPL)/ Laser for skin discoloration, including brown spots and redness.  Typically we recommend at least 1-3 treatment sessions about 5-8 weeks apart for best results.  Cannot have tanned skin when BBL performed, and regular use  of sunscreen/photoprotection is advised after the procedure to help maintain results. The patient's condition may also require maintenance treatments in the  future.  The fee for BBL / laser treatments is $350 per treatment session for the whole face.  A fee can be quoted for other parts of the body.  Insurance typically does not pay for BBL/laser treatments and therefore the fee is an out-of-pocket cost. Recommend prophylactic valtrex treatment. Once scheduled for procedure, will send Rx in prior to patient's appointment.     ACTINIC KERATOSIS (2) left ear x 1, right ear x 1 (2) Actinic keratoses are precancerous spots that appear secondary to cumulative UV radiation exposure/sun exposure over time. They are chronic with expected duration over 1 year. A portion of actinic keratoses will progress to squamous cell carcinoma of the skin. It is not possible to reliably predict which spots will progress to skin cancer and so treatment is recommended to prevent development of skin cancer.  Recommend daily broad spectrum sunscreen SPF 30+ to sun-exposed areas, reapply every 2 hours as needed.  Recommend staying in the shade or wearing long sleeves, sun glasses (UVA+UVB protection) and wide brim hats (4-inch brim around the entire circumference of the hat). Call for new or changing lesions. - Destruction of lesion - left ear x 1, right ear x 1 (2) Complexity: simple   Destruction method: cryotherapy   Informed consent: discussed and consent obtained   Timeout:  patient name, date of birth, surgical site, and procedure verified Lesion destroyed using liquid nitrogen: Yes   Region frozen until ice ball extended beyond lesion: Yes   Outcome: patient tolerated procedure well with no complications   Post-procedure details: wound care instructions given    Return in about 1 year (around 04/23/2025) for TBSE hx of bcc .  IEleanor Blush, CMA, am acting as scribe for Alm Rhyme, MD.  Documentation: I have reviewed the above documentation for accuracy and completeness, and I agree with the above.  Alm Rhyme, MD

## 2024-06-24 ENCOUNTER — Ambulatory Visit: Payer: Medicare Other | Admitting: Urology

## 2025-04-27 ENCOUNTER — Ambulatory Visit: Admitting: Dermatology
